# Patient Record
Sex: Female | Born: 1945 | Race: Black or African American | Hispanic: No | Marital: Single | Smoking: Never smoker
Health system: Southern US, Community
[De-identification: ages and names within clinical notes are randomized; demographics above are authoritative.]

## PROBLEM LIST (undated history)

## (undated) DIAGNOSIS — M1990 Unspecified osteoarthritis, unspecified site: Secondary | ICD-10-CM

## (undated) DIAGNOSIS — E7800 Pure hypercholesterolemia, unspecified: Secondary | ICD-10-CM

## (undated) DIAGNOSIS — M2012 Hallux valgus (acquired), left foot: Secondary | ICD-10-CM

## (undated) DIAGNOSIS — H269 Unspecified cataract: Secondary | ICD-10-CM

## (undated) DIAGNOSIS — K219 Gastro-esophageal reflux disease without esophagitis: Secondary | ICD-10-CM

## (undated) HISTORY — DX: Unspecified cataract: H26.9

## (undated) HISTORY — PX: ABDOMINAL HYSTERECTOMY: SUR658

## (undated) HISTORY — DX: Pure hypercholesterolemia, unspecified: E78.00

## (undated) HISTORY — DX: Gastro-esophageal reflux disease without esophagitis: K21.9

## (undated) HISTORY — DX: Hallux valgus (acquired), left foot: M20.12

## (undated) HISTORY — DX: Unspecified osteoarthritis, unspecified site: M19.90

---

## 2019-03-14 ENCOUNTER — Emergency Department (HOSPITAL_COMMUNITY)
Admission: EM | Admit: 2019-03-14 | Discharge: 2019-03-16 | Disposition: A | Discharge status: Other Acute Inpatient Hospital | Payer: Medicare Other | Attending: Emergency Medicine | Admitting: Emergency Medicine

## 2019-03-14 ENCOUNTER — Other Ambulatory Visit: Payer: Self-pay

## 2019-03-14 ENCOUNTER — Emergency Department (HOSPITAL_COMMUNITY): Payer: Medicare Other

## 2019-03-14 DIAGNOSIS — F29 Unspecified psychosis not due to a substance or known physiological condition: Secondary | ICD-10-CM | POA: Diagnosis not present

## 2019-03-14 DIAGNOSIS — F22 Delusional disorders: Secondary | ICD-10-CM | POA: Diagnosis not present

## 2019-03-14 DIAGNOSIS — R440 Auditory hallucinations: Secondary | ICD-10-CM | POA: Diagnosis not present

## 2019-03-14 LAB — CBC WITH DIFFERENTIAL/PLATELET
Abs Immature Granulocytes: 0.01 10*3/uL (ref 0.00–0.07)
Basophils Absolute: 0 10*3/uL (ref 0.0–0.1)
Basophils Relative: 0 %
Eosinophils Absolute: 0.1 10*3/uL (ref 0.0–0.5)
Eosinophils Relative: 1 %
HCT: 39.1 % (ref 36.0–46.0)
Hemoglobin: 12.9 g/dL (ref 12.0–15.0)
Immature Granulocytes: 0 %
Lymphocytes Relative: 36 %
Lymphs Abs: 2.3 10*3/uL (ref 0.7–4.0)
MCH: 33.7 pg (ref 26.0–34.0)
MCHC: 33 g/dL (ref 30.0–36.0)
MCV: 102.1 fL — ABNORMAL HIGH (ref 80.0–100.0)
Monocytes Absolute: 0.5 10*3/uL (ref 0.1–1.0)
Monocytes Relative: 8 %
Neutro Abs: 3.5 10*3/uL (ref 1.7–7.7)
Neutrophils Relative %: 55 %
Platelets: 296 10*3/uL (ref 150–400)
RBC: 3.83 MIL/uL — ABNORMAL LOW (ref 3.87–5.11)
RDW: 11.6 % (ref 11.5–15.5)
WBC: 6.4 10*3/uL (ref 4.0–10.5)
nRBC: 0 % (ref 0.0–0.2)

## 2019-03-14 LAB — URINALYSIS, ROUTINE W REFLEX MICROSCOPIC
Bacteria, UA: NONE SEEN
Bilirubin Urine: NEGATIVE
Glucose, UA: NEGATIVE mg/dL
Hgb urine dipstick: NEGATIVE
Ketones, ur: NEGATIVE mg/dL
Nitrite: NEGATIVE
Protein, ur: NEGATIVE mg/dL
Specific Gravity, Urine: 1.014 (ref 1.005–1.030)
pH: 7 (ref 5.0–8.0)

## 2019-03-14 LAB — RAPID URINE DRUG SCREEN, HOSP PERFORMED
Amphetamines: NOT DETECTED
Barbiturates: NOT DETECTED
Benzodiazepines: NOT DETECTED
Cocaine: NOT DETECTED
Opiates: NOT DETECTED
Tetrahydrocannabinol: NOT DETECTED

## 2019-03-14 LAB — ACETAMINOPHEN LEVEL: Acetaminophen (Tylenol), Serum: <10 ug/mL — ABNORMAL LOW (ref 10–30)

## 2019-03-14 LAB — COMPREHENSIVE METABOLIC PANEL
ALT: 17 U/L (ref 0–44)
AST: 23 U/L (ref 15–41)
Albumin: 4.4 g/dL (ref 3.5–5.0)
Alkaline Phosphatase: 65 U/L (ref 38–126)
Anion gap: 6 (ref 5–15)
BUN: 12 mg/dL (ref 8–23)
CO2: 29 mmol/L (ref 22–32)
Calcium: 9.4 mg/dL (ref 8.9–10.3)
Chloride: 105 mmol/L (ref 98–111)
Creatinine, Ser: 0.68 mg/dL (ref 0.44–1.00)
GFR calc Af Amer: >60 mL/min (ref >60)
GFR calc non Af Amer: >60 mL/min (ref >60)
Glucose, Bld: 103 mg/dL — ABNORMAL HIGH (ref 70–99)
Potassium: 3.7 mmol/L (ref 3.5–5.1)
Sodium: 140 mmol/L (ref 135–145)
Total Bilirubin: <0.1 mg/dL — ABNORMAL LOW (ref 0.3–1.2)
Total Protein: 8.1 g/dL (ref 6.5–8.1)

## 2019-03-14 LAB — ETHANOL: Alcohol, Ethyl (B): <10 mg/dL (ref <10)

## 2019-03-14 LAB — SALICYLATE LEVEL: Salicylate Lvl: <7 mg/dL (ref 2.8–30.0)

## 2019-03-14 LAB — AMMONIA: Ammonia: 34 umol/L (ref 9–35)

## 2019-03-14 LAB — TSH: TSH: 1.012 u[IU]/mL (ref 0.350–4.500)

## 2019-03-14 MED ORDER — ONDANSETRON HCL 4 MG PO TABS: 4.0000 mg | ORAL_TABLET | Freq: Three times a day (TID) | ORAL | Status: DC | PRN

## 2019-03-14 MED ORDER — ACETAMINOPHEN 325 MG PO TABS: 650.0000 mg | ORAL_TABLET | ORAL | Status: DC | PRN
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
## 2019-03-14 NOTE — ED Notes (Signed)
Patient transported to CT 

## 2019-03-14 NOTE — BH Assessment (Signed)
Faxed clinical information to the following facilities for placement:  [facility]  [facility]  [facility]  [facility]  [place], [facility], [facility], [facility] Triage Specialist [phone]
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
## 2019-03-14 NOTE — ED Provider Notes (Signed)
[facility]-EMERGENCY DEPT Provider Note   CSN: [id number] Arrival date & time: 03/14/19  1556    History   Chief Complaint Chief Complaint  Patient presents with  . Paranoid    HPI Sarah Grimes is a 73 y.o. female.     73yo F who p/w hallucinations.  Patient states that since October of last year, she has had progressively worsening problems with auditory hallucinations.  She states that she hears voices talking to her frequently.  They tell her that there are cameras all in her apartment to spy on her.  She says that she has not been able to escape and have any privacy.  She is gone over to her brother's house a few times but they have told her that they put cameras there too.  Her symptoms have progressively worsened and now she is having a lot of difficulty with sleep.  She has spoken with her PCP and has an appointment later this month.  She denies any headache, vision changes, fevers, or recent illness.  She cannot recall her current medications but denies any recent changes to her medications.  The history is provided by the patient.  LEVEL 5 CAVEAT DUE TO MENTAL STATUS CHANGE  No past medical history on file.  There are no active problems to display for this patient.   ** The histories are not reviewed yet. Please review them in the "History" navigator section and refresh this SmartLink.   OB History   No obstetric history on file.      Home Medications    Prior to Admission medications   Not on File    Family History No family history on file.  Social History Social History   Tobacco Use  . Smoking status: Not on file  Substance Use Topics  . Alcohol use: Not on file  . Drug use: Not on file     Allergies   Patient has no known allergies.   Review of Systems Review of Systems All other systems reviewed and are negative except that which was mentioned in HPI   Physical Exam Updated Vital Signs BP (!) 149/79   Pulse  60   Temp 98.1 F (36.7 C) (Oral)   Resp 18   Ht 5\' 5"  (1.651 m)   Wt 68 kg   SpO2 98%   BMI 24.96 kg/m   Physical Exam Vitals signs and nursing note reviewed.  Constitutional:      General: She is not in acute distress.    Appearance: She is well-developed.  HENT:     Head: Normocephalic and atraumatic.     Nose: Nose normal.     Mouth/Throat:     Mouth: Mucous membranes are moist.     Pharynx: Oropharynx is clear.  Eyes:     Extraocular Movements: Extraocular movements intact.     Conjunctiva/sclera: Conjunctivae normal.     Pupils: Pupils are equal, round, and reactive to light.  Neck:     Musculoskeletal: Neck supple.  Cardiovascular:     Rate and Rhythm: Normal rate and regular rhythm.     Heart sounds: Normal heart sounds. No murmur.  Pulmonary:     Effort: Pulmonary effort is normal.     Breath sounds: Normal breath sounds.  Abdominal:     General: Bowel sounds are normal. There is no distension.     Palpations: Abdomen is soft.     Tenderness: There is no abdominal tenderness.  Skin:  General: Skin is warm and dry.  Neurological:     Mental Status: She is alert and oriented to person, place, and time.     Comments: Fluent speech  Psychiatric:        Attention and Perception: Attention normal. She perceives auditory hallucinations.        Mood and Affect: Affect is tearful.        Speech: Speech normal.        Behavior: Behavior normal. Behavior is cooperative.        Thought Content: Thought content is paranoid and delusional.        Judgment: Judgment normal.      ED Treatments / Results  Labs (all labs ordered are listed, but only abnormal results are displayed) Labs Reviewed  COMPREHENSIVE METABOLIC PANEL - Abnormal; Notable for the following components:      Result Value   Glucose, Bld 103 (*)    Total Bilirubin <0.1 (*)    All other components within normal limits  ACETAMINOPHEN LEVEL - Abnormal; Notable for the following components:    Acetaminophen (Tylenol), Serum <10 (*)    All other components within normal limits  CBC WITH DIFFERENTIAL/PLATELET - Abnormal; Notable for the following components:   RBC 3.83 (*)    MCV 102.1 (*)    All other components within normal limits  URINALYSIS, ROUTINE W REFLEX MICROSCOPIC - Abnormal; Notable for the following components:   Leukocytes,Ua SMALL (*)    All other components within normal limits  TSH  AMMONIA  SALICYLATE LEVEL  ETHANOL  RAPID URINE DRUG SCREEN, HOSP PERFORMED    EKG None  Radiology Ct Head Wo Contrast  Result Date: 03/14/2019 CLINICAL DATA:  AMS, hallucinations, confusion EXAM: CT HEAD WITHOUT CONTRAST TECHNIQUE: Contiguous axial images were obtained from the base of the skull through the vertex without intravenous contrast. COMPARISON:  None. FINDINGS: Brain: No evidence of acute infarction, hemorrhage, hydrocephalus, extra-axial collection or mass lesion/mass effect. Periventricular and deep white matter hypodensity. Vascular: No hyperdense vessel or unexpected calcification. Skull: Normal. Negative for fracture or focal lesion. Sinuses/Orbits: No acute finding. Other: None. IMPRESSION: No acute intracranial pathology.  Small-vessel white matter disease. Electronically Signed   By: Eddie Candle M.D.   On: 03/14/2019 17:17    Procedures Procedures (including critical care time)  Medications Ordered in ED Medications - No data to display   Initial Impression / Assessment and Plan / ED Course  I have reviewed the triage vital signs and the nursing notes.  Pertinent labs & imaging results that were available during my care of the patient were reviewed by me and considered in my medical decision making (see chart for details).       PT alert, pleasant, occasionally tearful but no racing speech or tangential thought process. Obtained screening labs and head CT to r/u intracranial process.  Labwork unremarkable. Normal TSH, tox labs negative, no evidence of  infectious process. Head CT negative.  I had a long discussion with daughter who states that her symptoms have been progressively worsening and PCP has been trying to get her in with a psychiatrist but the appointment is not until July.  Daughter is concerned about her safety as patient has been going outside, scared to stay inside because she feels like she is being watched.  It sounds like the patient is being considered for a diagnosis of dementia.  I have contacted TTS and they have recommended inpatient geriatric psych treatment. PT will await bed placement.  Final Clinical Impressions(s) / ED Diagnoses   Final diagnoses:  Auditory hallucinations  Delusions [facility](HCC)    ED Discharge Orders    None       , Ambrose Finlandachel Morgan, MD 03/14/19 2049

## 2019-03-15 DIAGNOSIS — F22 Delusional disorders: Secondary | ICD-10-CM

## 2019-03-15 DIAGNOSIS — R440 Auditory hallucinations: Secondary | ICD-10-CM | POA: Insufficient documentation

## 2019-03-15 MED ORDER — PANTOPRAZOLE SODIUM 40 MG PO TBEC
40.0000 mg | DELAYED_RELEASE_TABLET | Freq: Every day | ORAL | Status: DC
  Administered 2019-03-15: 40 mg via ORAL
  Filled 2019-03-15: qty 1

## 2019-03-15 MED ORDER — TRAZODONE HCL 50 MG PO TABS
50.0000 mg | ORAL_TABLET | Freq: Every day | ORAL | Status: DC
  Administered 2019-03-15: 50 mg via ORAL
  Filled 2019-03-15: qty 1

## 2019-03-15 MED ORDER — LISINOPRIL 5 MG PO TABS
5.0000 mg | ORAL_TABLET | Freq: Every day | ORAL | Status: DC
  Administered 2019-03-15: 5 mg via ORAL
  Filled 2019-03-15 (×2): qty 1

## 2019-03-15 MED ORDER — FAMOTIDINE 20 MG PO TABS: 20.0000 mg | ORAL_TABLET | Freq: Every day | ORAL | Status: DC

## 2019-03-15 MED ORDER — SIMVASTATIN 40 MG PO TABS
40.0000 mg | ORAL_TABLET | Freq: Every day | ORAL | Status: DC
  Administered 2019-03-15: 40 mg via ORAL
  Filled 2019-03-15 (×2): qty 1

## 2019-03-15 NOTE — ED Notes (Signed)
Patient's IVC complete and signed. Sheriffs office called for patient transport, but was told it was too late and for nurse to call tomorrow at 0800.

## 2019-03-15 NOTE — Progress Notes (Signed)
Patient is delusional and paranoid.  She will be IVC'd based on her altered cognition and will be IVC'd prior to going to [facility], [facility].  Daughter, [name], was called per patient's permission, no answer with a "voicemail is full" message.    Waylan Boga, PMHNP

## 2019-03-15 NOTE — ED Notes (Signed)
Patient ambulated to restroom with standby assist. Patient ambulated with steady gait. Breakfast tray provided to patient. Patient VSS.

## 2019-03-15 NOTE — ED Notes (Signed)
Patient is IVC 
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
## 2019-03-15 NOTE — BH Assessment (Addendum)
[facility] Assessment Progress Note  Per Buford Dresser, DO, this pt requires psychiatric hospitalization at this time.  Waylan Boga, DNP  also finds that pt meets criteria for IVC, which she has initiated.  IVC documents have been faxed to [facility], and at [employer] confirms receipt.  She has since faxed Findings and Custody Order to this Probation officer.  At 16:12 I called [employer], who took demographic information, agreeing to dispatch law enforcement to fill out Return of Service.  As of this writing arrival of law enforcement is pending.   At 12:25 Tammy calls from [place] to report that pt has been accepted to their facility by Dr Mel Almond.  Dr Mariea Clonts concurs with this decision.  Pt's nurse has been notified, and agrees to call report to [phone].  Pt is to be transported via [facility] Coordinator [phone]   Addendum:  At 16:44 Karlene Einstein at [facility] confirms receipt of IVC documents.  Jalene Mullet, [place] Coordinator [phone]

## 2019-03-15 NOTE — ED Notes (Addendum)
Report called to [employer] at [facility].

## 2019-03-15 NOTE — Consult Note (Addendum)
[facility] Face-to-Face Psychiatry Consult   Reason for Consult:  Paranoia, hallucinations, delusions Referring Physician:  EDP Patient Identification: Sarah Grimes MRN:  [id number] Principal Diagnosis: Paranoia Diagnosis:  Acute Psychosis Total Time spent with patient: 45 minutes  Subjective:   Sarah Grimes is a 73 y.o. female patient admitted with paranoia that people she sees are following her, visual and auditory hallucinations.  Anxiety is high related to this.  The voices told her to go outside her apartment where her son found her sitting in the heat.  She does not bathe due to the people watching her and eats minimal.  On assessment, she talks about an inplant in her head where they can listen to her even in the ED.  Pleasant and cooperative with no substance abuse or suicidal/homicidal ideations.  Called her daughter with her permission but no answer and voicemail was full.  HPI from TTS on admission:  73 y.o. female who presents to [facility] ED reporting auditory hallucinations and symptoms of paranoia. Pt says her symptoms started in October of 2019 when she was hearing loud music coming from her neighbor's residence that other people didn't hear. Pt says for the past month she has been hearing her neighbors talking. She says she hears a voice telling her that there are cameras installed in her house and car. She says a voice tells her that her neighbor is on her porch or coming through her window. She says she believes someone has put a stint in her ear that is transmitting these voices. She says the voices used to only be in her house but now they are following her in other locations and describing all her actions.   Pt says "I have no peace." She says she is unable to sleep more than a couple of hours. She says she has not been bathing as often as she normally does because she doesn't want people to see her through the cameras. She says she is eating less because she feels  anxious. She denies current suicidal ideation or any history of suicide attempts. Pt denies any history of intentional self-injurious behaviors. Pt denies current homicidal ideation or history of violence. Pt denies any history of auditory or visual hallucinations. Pt denies history of alcohol or other substance use.  Pt lives alone in an apartment. She identifies her brother and her daughter as her primary supports. Pt says she discussed hearing voices with her primary care physician who recommended Pt see a psychiatrist. Pt cannot identify any stressors other than these voices. Pt has hearing loss but is able to hear over the telephone and carry on a normal conversation. She denies any history of inpatient or outpatient mental health treatment.   With Pt's permission, TTS spoke with Pt's daughter, Sarah Grimes [phone]. She confirms these symptoms started with hearing music in October. She says these voices have gradually increased. She says Pt has misplaced objects an insisted the neighbor has enter her apartment and taken things. Pt has called law enforcement on the neighbors. She says Pt won't turn the lights on because she doesn't want the cameras to see her. She says she has found Pt sitting outside her apartment in the hot sun because voices have told her there is a bomb in the apartment. Pt's daughter reports yesterday Pt said that the voice was telling her to kill herself. Daughter called Pt's PCP who said to bring Pt to ED. Daughter confirms Pt is completely independent in caring for  herself.  Past Psychiatric History: Auditory hallucinations  Risk to Self: Suicidal Ideation: No Suicidal Intent: No Is patient at risk for suicide?: No Suicidal Plan?: No Access to Means: No What has been your use of drugs/alcohol within the last 12 months?: Pt denies How many times?: 0 Other Self Harm Risks: Pt responding to auditory hallucinations Triggers for Past Attempts: None  known Intentional Self Injurious Behavior: None Risk to Others: Homicidal Ideation: No Thoughts of Harm to Others: No Current Homicidal Intent: No Current Homicidal Plan: No Access to Homicidal Means: No Identified Victim: None History of harm to others?: No Assessment of Violence: None Noted Violent Behavior Description: Pt denies history of violence Does patient have access to weapons?: No Criminal Charges Pending?: No Does patient have a court date: No Prior Inpatient Therapy: Prior Inpatient Therapy: No Prior Outpatient Therapy: Prior Outpatient Therapy: No Does patient have an ACCT team?: No Does patient have Intensive In-House Services?  : No Does patient have [facility] services? : No Does patient have P4CC services?: No  Family History: No family history on file. Family Psychiatric  History: None Social History:  Social History   Substance and Sexual Activity  Alcohol Use Not on file     Social History   Substance and Sexual Activity  Drug Use Not on file    Social History   Socioeconomic History  . Marital status: Single    Spouse name: Not on file  . Number of children: Not on file  . Years of education: Not on file  . Highest education level: Not on file  Occupational History  . Not on file  Social Needs  . Financial resource strain: Not on file  . Food insecurity:    Worry: Not on file    Inability: Not on file  . Transportation needs:    Medical: Not on file    Non-medical: Not on file  Tobacco Use  . Smoking status: Not on file  Substance and Sexual Activity  . Alcohol use: Not on file  . Drug use: Not on file  . Sexual activity: Not on file  Lifestyle  . Physical activity:    Days per week: Not on file    Minutes per session: Not on file  . Stress: Not on file  Relationships  . Social connections:    Talks on phone: Not on file    Gets together: Not on file    Attends religious service: Not on file    Active member of club or  organization: Not on file    Attends meetings of clubs or organizations: Not on file    Relationship status: Not on file  Other Topics Concern  . Not on file  Social History Narrative  . Not on file   Additional Social History: N/A    Allergies:  No Known Allergies  Labs:  Results for orders placed or performed during the hospital encounter of 03/14/19 (from the past 48 hour(s))  Comprehensive metabolic panel     Status: Abnormal   Collection Time: 03/14/19  4:40 PM  Result Value Ref Range   Sodium 140 135 - 145 mmol/L   Potassium 3.7 3.5 - 5.1 mmol/L   Chloride 105 98 - 111 mmol/L   CO2 29 22 - 32 mmol/L   Glucose, Bld 103 (H) 70 - 99 mg/dL   BUN 12 8 - 23 mg/dL   Creatinine, Ser 4.090.68 0.44 - 1.00 mg/dL   Calcium 9.4 8.9 - 81.110.3 mg/dL  Total Protein 8.1 6.5 - 8.1 g/dL   Albumin 4.4 3.5 - 5.0 g/dL   AST 23 15 - 41 U/L   ALT 17 0 - 44 U/L   Alkaline Phosphatase 65 38 - 126 U/L   Total Bilirubin <0.1 (L) 0.3 - 1.2 mg/dL   GFR calc non Af Amer >60 >60 mL/min   GFR calc Af Amer >60 >60 mL/min   Anion gap 6 5 - 15    Comment: Performed at [facility], [place] [address] [place], [place] [address]  TSH     Status: None   Collection Time: 03/14/19  4:40 PM  Result Value Ref Range   TSH 1.012 0.350 - 4.500 uIU/mL    Comment: Performed by a 3rd Generation assay with a functional sensitivity of <=0.01 uIU/mL. Performed at [facility], [place] [address], [place], [place] [address]   Ammonia     Status: None   Collection Time: 03/14/19  4:40 PM  Result Value Ref Range   Ammonia 34 9 - 35 umol/L    Comment: Performed at [facility], [place] [address], [place], [place] [address]  Acetaminophen level     Status: Abnormal   Collection Time: 03/14/19  4:40 PM  Result Value Ref Range   Acetaminophen (Tylenol), Serum <10 (L) 10 - 30 ug/mL    Comment: (NOTE) Therapeutic concentrations vary significantly. A range of 10-30 ug/mL  may  be an effective concentration for many patients. However, some  are best treated at concentrations outside of this range. Acetaminophen concentrations >150 ug/mL at 4 hours after ingestion  and >50 ug/mL at 12 hours after ingestion are often associated with  toxic reactions. Performed at [facility], [place] [address], [place], [place] [address]   Salicylate level     Status: None   Collection Time: 03/14/19  4:40 PM  Result Value Ref Range   Salicylate Lvl <4.2 2.8 - 30.0 mg/dL    Comment: Performed at [facility], [place] [address], [place], [place] [address]  Ethanol     Status: None   Collection Time: 03/14/19  4:40 PM  Result Value Ref Range   Alcohol, Ethyl (B) <10 <10 mg/dL    Comment: (NOTE) Lowest detectable limit for serum alcohol is 10 mg/dL. For medical purposes only. Performed at [facility], [place] [address], [place],  [address]   CBC with Differential     Status: Abnormal   Collection Time: 03/14/19  4:40 PM  Result Value Ref Range   WBC 6.4 4.0 - 10.5 K/uL   RBC 3.83 (L) 3.87 - 5.11 MIL/uL   Hemoglobin 12.9 12.0 - 15.0 g/dL   HCT 39.1 36.0 - 46.0 %   MCV 102.1 (H) 80.0 - 100.0 fL   MCH 33.7 26.0 - 34.0 pg   MCHC 33.0 30.0 - 36.0 g/dL   RDW 11.6 11.5 - 15.5 %   Platelets 296 150 - 400 K/uL   nRBC 0.0 0.0 - 0.2 %   Neutrophils Relative % 55 %   Neutro Abs 3.5 1.7 - 7.7 K/uL   Lymphocytes Relative 36 %   Lymphs Abs 2.3 0.7 - 4.0 K/uL   Monocytes Relative 8 %   Monocytes Absolute 0.5 0.1 - 1.0 K/uL   Eosinophils Relative 1 %   Eosinophils Absolute 0.1 0.0 - 0.5 K/uL   Basophils Relative 0 %   Basophils Absolute 0.0 0.0 - 0.1 K/uL  Immature Granulocytes 0 %   Abs Immature Granulocytes 0.01 0.00 - 0.07 K/uL    Comment: Performed at [facility], [address] [place], [place] [address]  Urinalysis, Routine w reflex microscopic     Status: Abnormal   Collection Time: 03/14/19   5:45 PM  Result Value Ref Range   Color, Urine YELLOW YELLOW   APPearance CLEAR CLEAR   Specific Gravity, Urine 1.014 1.005 - 1.030   pH 7.0 5.0 - 8.0   Glucose, UA NEGATIVE NEGATIVE mg/dL   Hgb urine dipstick NEGATIVE NEGATIVE   Bilirubin Urine NEGATIVE NEGATIVE   Ketones, ur NEGATIVE NEGATIVE mg/dL   Protein, ur NEGATIVE NEGATIVE mg/dL   Nitrite NEGATIVE NEGATIVE   Leukocytes,Ua SMALL (A) NEGATIVE   RBC / HPF 0-5 0 - 5 RBC/hpf   WBC, UA 0-5 0 - 5 WBC/hpf   Bacteria, UA NONE SEEN NONE SEEN   Squamous Epithelial / LPF 0-5 0 - 5    Comment: Performed at [facility], [address] [place], [place] [address]  Urine rapid drug screen (hosp performed)     Status: None   Collection Time: 03/14/19  5:45 PM  Result Value Ref Range   Opiates NONE DETECTED NONE DETECTED   Cocaine NONE DETECTED NONE DETECTED   Benzodiazepines NONE DETECTED NONE DETECTED   Amphetamines NONE DETECTED NONE DETECTED   Tetrahydrocannabinol NONE DETECTED NONE DETECTED   Barbiturates NONE DETECTED NONE DETECTED    Comment: (NOTE) DRUG SCREEN FOR MEDICAL PURPOSES ONLY.  IF CONFIRMATION IS NEEDED FOR ANY PURPOSE, NOTIFY LAB WITHIN 5 DAYS. LOWEST DETECTABLE LIMITS FOR URINE DRUG SCREEN Drug Class                     Cutoff (ng/mL) Amphetamine and metabolites    1000 Barbiturate and metabolites    200 Benzodiazepine                 200 Tricyclics and metabolites     300 Opiates and metabolites        300 Cocaine and metabolites        300 THC                            50 Performed at [facility], [address] [place], [place] [address]     Current Facility-Administered Medications  Medication Dose Route Frequency Provider Last Rate Last Dose  . acetaminophen (TYLENOL) tablet 650 mg  650 mg Oral Q4H PRN Little, Ambrose Finlandachel Morgan, MD      . famotidine (PEPCID) tablet 20 mg  20 mg Oral Daily Lord, Jamison Y, NP      . lisinopril (ZESTRIL) tablet 5 mg  5 mg Oral  Daily Charm RingsLord, Jamison Y, NP   5 mg at 03/15/19 0953  . ondansetron (ZOFRAN) tablet 4 mg  4 mg Oral Q8H PRN Little, Ambrose Finlandachel Morgan, MD      . pantoprazole (PROTONIX) EC tablet 40 mg  40 mg Oral Daily Charm RingsLord, Jamison Y, NP   40 mg at 03/15/19 0953  . simvastatin (ZOCOR) tablet 40 mg  40 mg Oral Daily Charm RingsLord, Jamison Y, NP   40 mg at 03/15/19 0953  . traZODone (DESYREL) tablet 50 mg  50 mg Oral QHS Charm RingsLord, Jamison Y, NP       Current Outpatient Medications  Medication Sig Dispense Refill  . famotidine (PEPCID) 20 MG tablet Take 20 mg  by mouth daily.    Marland Kitchen. lisinopril (ZESTRIL) 5 MG tablet Take 5 mg by mouth daily.    . pantoprazole (PROTONIX) 40 MG tablet Take 40 mg by mouth daily.    . simvastatin (ZOCOR) 40 MG tablet Take 40 mg by mouth daily.    . traZODone (DESYREL) 50 MG tablet Take 50 mg by mouth at bedtime.      Musculoskeletal: Strength & Muscle Tone: within normal limits Gait & Station: normal Patient leans: N/A  Psychiatric Specialty Exam: Physical Exam  Nursing note and vitals reviewed. Constitutional: She is oriented to person, place, and time. She appears well-developed and well-nourished.  HENT:  Head: Normocephalic.  Neck: Normal range of motion.  Respiratory: Effort normal.  Musculoskeletal: Normal range of motion.  Neurological: She is alert and oriented to person, place, and time.  Psychiatric: Her speech is normal and behavior is normal. Her mood appears anxious. Thought content is paranoid. Cognition and memory are impaired. She expresses inappropriate judgment.    Review of Systems  Psychiatric/Behavioral: The patient is nervous/anxious.   All other systems reviewed and are negative.   Blood pressure (!) 98/57, pulse 69, temperature 97.8 F (36.6 C), resp. rate 17, height 5\' 5"  (1.651 m), weight 68 kg, SpO2 99 %.Body mass index is 24.96 kg/m.  General Appearance: Casual  Eye Contact:  Good  Speech:  Normal Rate  Volume:  Normal  Mood:  Anxious  Affect:  Congruent   Thought Process:  Coherent and Descriptions of Associations: Intact  Orientation:  Full (Time, Place, and Person)  Thought Content:  Delusions and Paranoid Ideation  Suicidal Thoughts:  No  Homicidal Thoughts:  No  Memory:  Immediate;   Good Recent;   Good Remote;   Good  Judgement:  Fair  Insight:  Lacking  Psychomotor Activity:  Normal  Concentration:  Concentration: Fair and Attention Span: Fair  Recall:  Good  Fund of Knowledge:  Good  Language:  Good  Akathisia:  No  Handed:  Right  AIMS (if indicated):   N/A  Assets:  Housing Leisure Time Physical Health Resilience Social Support  ADL's:  Intact  Cognition:  Impaired,  Mild  Sleep:   N/A     Treatment Plan Summary: Daily contact with patient to assess and evaluate symptoms and progress in treatment, Medication management and Plan paranoia, acute psychosis:  -Inpatient hospitalization, transferred to accepting hospital  Insomnia: -Started Trazodone 50 mg qhs  Disposition: Recommend psychiatric Inpatient admission when medically cleared.  Nanine MeansJamison Lord, NP 03/15/2019 3:34 PM   Patient's chart reviewed and case discussed with the physician extender and developed treatment plan. Reviewed the information documented and agree with the treatment plan.  Juanetta BeetsJacqueline , DO 03/17/19 2:56 PM

## 2019-03-16 NOTE — Discharge Instructions (Signed)
Transfer to [facility].

## 2019-03-16 NOTE — Progress Notes (Signed)
Received Sarah Grimes in her room asleep with the sitter at the bedside. No noted distress. She continued to sleep throughout the night.

## 2019-03-16 NOTE — ED Provider Notes (Signed)
Patient alert, content, nad.  [facility] team indicates accepted to [facility], Dr Gordan Payment. Vital signs normal.  Patient appears stable for transfer.      Lajean Saver, MD 03/16/19 [phone]

## 2019-03-16 NOTE — ED Notes (Signed)
Pt discharged safely after calling her daughter.  Family was upset that she was going so far.  I explained that we have to take the hospital that accepts her.  Pt was in no distress.  She was paranoid and delusional

## 2019-03-16 NOTE — Discharge Summary (Signed)
  Patient to be transferred to [facility] for inpatient psychiatric treatment  Yosiah Jasmin B. Fitzpatrick Alberico, NP

## 2019-04-21 ENCOUNTER — Ambulatory Visit (INDEPENDENT_AMBULATORY_CARE_PROVIDER_SITE_OTHER): Payer: Medicare Other | Admitting: Neurology

## 2019-04-21 ENCOUNTER — Other Ambulatory Visit: Payer: Self-pay

## 2019-04-21 ENCOUNTER — Telehealth: Payer: Self-pay | Admitting: Neurology

## 2019-04-21 ENCOUNTER — Telehealth: Payer: Self-pay

## 2019-04-21 ENCOUNTER — Encounter: Payer: Self-pay | Admitting: Neurology

## 2019-04-21 VITALS — BP 112/65 | HR 72 | Ht 64.0 in | Wt 149.0 lb

## 2019-04-21 DIAGNOSIS — F0391 Unspecified dementia with behavioral disturbance: Secondary | ICD-10-CM | POA: Diagnosis not present

## 2019-04-21 DIAGNOSIS — R454 Irritability and anger: Secondary | ICD-10-CM

## 2019-04-21 DIAGNOSIS — R440 Auditory hallucinations: Secondary | ICD-10-CM | POA: Diagnosis not present

## 2019-04-21 DIAGNOSIS — G3109 Other frontotemporal dementia: Secondary | ICD-10-CM | POA: Diagnosis not present

## 2019-04-21 NOTE — Telephone Encounter (Signed)
[employer] Medicare order sent to [facility]. No auth they will reach out to the patient to schedule.  °

## 2019-04-21 NOTE — Telephone Encounter (Signed)
Dr. Duwaine Maxin at [facility] referred the pt to [place]. Unfortunately, that office cannot send [place] records to assist with the referral.

## 2019-04-21 NOTE — Progress Notes (Signed)
Subjective:    Patient ID: Precious HawsBetty Ruth Ervine is a 73 y.o. female.  HPI     Huston FoleySaima Breyer Tejera, MD, PhD [facility] [address], [address] [address] [place], [place] [id number]  Dear Dr. Andrey CampanileWilson, I saw your patient, Denyce RobertBetty Santacroce, upon your kind request to my neurologic clinic today for initial consultation of her cognitive disorder, concern for Alzheimer's disease with behavioral disturbance.  The patient is accompanied by her daughter, Tamala JulianGvonnia, today.  As you know, Ms. Adrian BlackwaterStinson is a 73 year old right-handed woman with an underlying medical history of hypertension, hyperlipidemia, cataracts, osteoarthritis, diabetes, and reflux disease, who has had altered mental status recently, and a psychiatric admission warranted secondary to delusions and hallucinations in June 2020.  I was able to review some hospital records, and we requested your office records as well.  She [facility] secondary to delusions and hallucinations.  Her symptoms started in October.  She has had auditory hallucinations primarily, she also had paranoid delusions, no premorbid mood disorder.  In the hospital, she was treated with IM Benadryl as needed, Colace as needed, haloperidol 5 mg every 4 hours as needed as well as IM Haldol, hydroxyzine 50 mg every 4 hours as needed and IM Ativan 2 mg strength every 6 hours as needed as well as Ambien 5 mg strength nightly as needed.  She was discharged from her psychiatric admission on 03/26/19, per daughter. She was kept on Haldol only.  She is no longer on her reflux or cholesterol medicine or blood pressure medicine or baby aspirin.  I reviewed your office notes as well from 01/07/2018, 04/14/2018, 07/16/2018, 01/21/2019, at which time she had blood work including A1c, CMP, CBC with differential, lipid profile, vitamin D.  She was advised to seek help with her hearing aids at the time.  I reviewed her blood test  results, A1c was 6.0, LDL 105, AST 21, ALT 14, creatinine 0.65, BUN 11.  She had a follow-up on 02/12/2019.  At that time her daughter was concerned about behavioral changes in her mother over the past year, she was having visual hallucinations.  She had a follow-up on 02/17/2019.  She had memory changes.  She had ongoing auditory hallucinations, daughter had mentioned cognitive changes for about 6 months at the time including misplacing things.  She had a follow-up on 03/30/2019.  Daughter reports forgetfulness and patient has been misplacing things.  The patient has 1 daughter and 1 son.  [place] in [place], [place] in [place].  Since her hospital discharge they have been taking turns to stay with her overnight, she is not alone at night.  Daughter reports ongoing issues with delusions and the neighbors that she was concerned about have not been staying there, have moved away a while ago.  The patient does insist that these are not hallucinations and these is talking to.  She is currently very cooperative and able to participate in the history but daughter reports that patient has had some anger outburst as well.  She is not sleeping very well.  She takes Haldol 3 times a day.  She has an appointment with a psychiatrist next month.  She has not been driving very much but has driven since her hospital discharge to her daughter's house.  She is a non-smoker and does not utilize alcohol and drinks caffeine in the form of sweet tea, about 2 glasses/day on average.  She has been from a larger  family, she had a total of 6 brothers, one passed away, she had 5 sisters, 2 passed away.  None of her siblings had any memory issues or have any memory loss.  Her father apparently had some memory issues in the later years of his life, passed away in his 4180s.  Her Past Medical History Is Significant For: Past Medical History:  Diagnosis Date  . Cataract, bilateral   . GERD (gastroesophageal reflux disease)   . Hallux  valgus, left   . High cholesterol   . Osteoarthritis     Her Past Surgical History Is Significant For:   Her Family History Is Significant For: No family history on file.  Her Social History Is Significant For: Social History   Socioeconomic History  . Marital status: Single    Spouse name: Not on file  . Number of children: Not on file  . Years of education: Not on file  . Highest education level: Not on file  Occupational History  . Not on file  Social Needs  . Financial resource strain: Not on file  . Food insecurity    Worry: Not on file    Inability: Not on file  . Transportation needs    Medical: Not on file    Non-medical: Not on file  Tobacco Use  . Smoking status: Never Smoker  Substance and Sexual Activity  . Alcohol use: Never    Frequency: Never  . Drug use: Not on file  . Sexual activity: Not on file  Lifestyle  . Physical activity    Days per week: Not on file    Minutes per session: Not on file  . Stress: Not on file  Relationships  . Social Musicianconnections    Talks on phone: Not on file    Gets together: Not on file    Attends religious service: Not on file    Active member of club or organization: Not on file    Attends meetings of clubs or organizations: Not on file    Relationship status: Not on file  Other Topics Concern  . Not on file  Social History Narrative  . Not on file    Her Allergies Are:  No Known Allergies:   Her Current Medications Are:  Outpatient Encounter Medications as of 04/21/2019  Medication Sig  . haloperidol (HALDOL) 0.5 MG tablet Take 0.5 mg by mouth 2 (two) times daily.   . haloperidol (HALDOL) 1 MG tablet Take 1 mg by mouth at bedtime.   . [DISCONTINUED] Calcium Carbonate-Simethicone (MAALOX MAX PO) Take by mouth.  . [DISCONTINUED] docusate sodium (COLACE) 100 MG capsule Take 100 mg by mouth 2 (two) times daily as needed for mild constipation.  . [DISCONTINUED] famotidine (PEPCID) 20 MG tablet Take 20 mg by mouth  daily.  . [DISCONTINUED] hydrOXYzine (ATARAX/VISTARIL) 50 MG tablet Take 50 mg by mouth every 4 (four) hours as needed.  . [DISCONTINUED] lisinopril (ZESTRIL) 5 MG tablet Take 5 mg by mouth daily.  . [DISCONTINUED] pantoprazole (PROTONIX) 40 MG tablet Take 40 mg by mouth daily.  . [DISCONTINUED] simvastatin (ZOCOR) 40 MG tablet Take 40 mg by mouth daily.  . [DISCONTINUED] traZODone (DESYREL) 50 MG tablet Take 50 mg by mouth at bedtime.  . [DISCONTINUED] zolpidem (AMBIEN) 5 MG tablet Take 5 mg by mouth at bedtime as needed for sleep.   No facility-administered encounter medications on file as of 04/21/2019.   :   Review of Systems:  Out of a complete 14 point  review of systems, all are reviewed and negative with the exception of these symptoms as listed below:    Review of Systems  Neurological:       Pt presents today to discuss her auditory hallucinations. She is wondering if it could be caused by dementia. She was told to stop all medications except haldol until cleared by neuro.    Objective:  Neurological Exam  Physical Exam Physical Examination:   Vitals:   04/21/19 1437  BP: 112/65  Pulse: 72    General Examination: The patient is a very pleasant 73 y.o. female in no acute distress. She appears well-developed and well-nourished and well groomed.   HEENT: Normocephalic, atraumatic, pupils are equal, round and reactive to light and accommodation. Extraocular tracking is well preserved, hearing is Impaired.  She has no significant facial masking, she has normal facial symmetry, normal facial sensation, airway examination reveals mild to moderate mouth dryness, tongue protrudes centrally in palate elevates symmetrically.  No involuntary abnormal movements of the tongue or mouth.  Neck is supple with no nuchal rigidity noted  Chest: Clear to auscultation without wheezing, rhonchi or crackles noted.  Heart: S1+S2+0, regular and normal without murmurs, rubs or gallops noted.    Abdomen: Soft, non-tender and non-distended with normal bowel sounds appreciated on auscultation.  Extremities: There is trace pitting edema in the R ankle. Pedal pulses are intact.  Skin: Warm and dry without trophic changes noted. There are no varicose veins.  Musculoskeletal: exam reveals no obvious joint deformities, tenderness or joint swelling or erythema.   Neurologically:  Mental status: The patient is awake, alert and oriented in all 4 spheres, with the exception of Dr. And state.  Her remote memory is fairly well-preserved, she does have difficulty relating her recent history.She is calm and cooperative, mood is normal, she has no obvious anxiety, no obvious hallucinations.  On 04/21/2019: MMSE: 22/30 (She missed 2 points on Orientation, 4 points on serial 7, 1 point on recall, 1 point on copying the figure.), CDT: 3/4, AFT: 7/min. Cranial nerves II - XII are as described above under HEENT exam. In addition: shoulder shrug is normal with equal shoulder height noted. Motor exam: Normal bulk, strength and tone is noted. There is no drift, tremor or rebound. Romberg is negative. Reflexes are 2+ throughout. Babinski: Toes are flexor bilaterally. Fine motor skills and coordination: grossly intact.  Cerebellar testing: No dysmetria or intention tremor on finger to nose testing. Heel to shin is unremarkable bilaterally. There is no truncal or gait ataxia.  Sensory exam: intact to light touch in the upper and lower extremities.  Gait, station and balance: She stands easily. No veering to one side is noted. No leaning to one side is noted. Posture is age-appropriate and stance is narrow based. Gait shows normal stride length and normal pace. No problems turning are noted.              Assessment and Plan:  Assessment and Plan:  In summary, Vergene Josephina ShihRuth Nishi is a very pleasant 73 y.o.-year old female with an underlying medical history of hypertension, hyperlipidemia, cataracts,  osteoarthritis, diabetes, and reflux disease, who Presents for evaluation of her memory loss.  She had last month for hallucinations and delusions.  She is currently on Haldol and has a follow-up with a psychiatrist pending next month.  Her history is not telltale for Alzheimer's disease but dementia with behavioral disturbance is certainly in the differential including frontotemporal dementia, Lewy body dementia.  However, she  has no signs of parkinsonism and the latter diagnosis is unlikely in my opinion.  I am not sure what work-up she had in the hospital setting, she had blood work in the recent past through your office.  We will add additional blood work today, in addition I would like to do an EEG and brain MRI with and without contrast as well is more in-depth memory evaluation with the help of a neuropsychologist.  I discussed this At length with the patient and her daughter.  Supervision is key.  She is furthermore advised no longer to drive as she may get lost and confused. I had a long chat with the patient and her daughter about my findings and the diagnosis of memory loss and dementia, its prognosis and treatment options. We will seek work-up and consider medication for dementia in the near future. We talked about medical treatments and non-pharmacological approaches. We talked about maintaining a healthy lifestyle in general and staying active mentally and physically. I encouraged the patient to eat healthy, exercise daily and keep well hydrated, to keep a scheduled bedtime and wake time routine, to not skip any meals and eat healthy snacks in between meals and to have protein with every meal. I stressed the importance of regular exercise, within of course the patient's own mobility limitations. She has lost some weight as I understand. I suggested close supervision and close psychiatric follow-up as well.  I answered all their questions today and the patient her daughter were in agreement with the  above outlined plan. I would like to see the patient back after these tests/evaluations and We will also keep them posted in the interim by phone call regarding her test results. This was an extended visit.    Thank you very much for allowing me to participate in the care of this nice patient. If I can be of any further assistance to you please do not hesitate to call me at [phone].  Sincerely,   Star Age, MD, PhD

## 2019-04-21 NOTE — Telephone Encounter (Signed)
Dr. Rexene Alberts needs further records for this pt's neuro and [facility] admission prior to her appt with pt, per Dr. Guadelupe Sabin request. I called the referring office Dr. Redmond Pulling at [facility], was on the phone for 10 minutes. They could not help me obtain these records but will ask clinical staff for assistance with this and get back with [place]. If this office calls back, please ask them to fax all pertinent neurological, psychiatry, and behavioral health records to [phone].

## 2019-04-21 NOTE — Patient Instructions (Addendum)
We will try to get to the bottom of your memory issues.  Please follow-up with your primary care physician and your psychiatrist as well.  I do not suggest any new medications quite yet. We will check blood work today and call you with the test results. We will do an EEG (brainwave test), which we will schedule. We will call you with the results. We will do a brain scan, called MRI and call you with the test results. We will have to schedule you for this on a separate date. This test requires authorization from your insurance, and we will take care of the insurance process. We do a formal neuropsychological test (aka cognitive testing) for your memory complaints. This requires a referral to a trained and licensed neuropsychologist and will be a separate appointment at a different clinic.

## 2019-04-21 NOTE — Telephone Encounter (Signed)
Sarah Grimes from [facility] in [place] called stating that they do not have the records because they didnt do the referral. The pt was referred by the behavioral health hospital in [place]. The main number to them is [phone]

## 2019-04-25 LAB — COMPREHENSIVE METABOLIC PANEL
ALT: 11 IU/L (ref 0–32)
AST: 18 IU/L (ref 0–40)
Albumin/Globulin Ratio: 1.4 (ref 1.2–2.2)
Albumin: 4 g/dL (ref 3.7–4.7)
Alkaline Phosphatase: 68 IU/L (ref 39–117)
BUN/Creatinine Ratio: 12 (ref 12–28)
BUN: 8 mg/dL (ref 8–27)
Bilirubin Total: 0.2 mg/dL (ref 0.0–1.2)
CO2: 25 mmol/L (ref 20–29)
Calcium: 9.4 mg/dL (ref 8.7–10.3)
Chloride: 103 mmol/L (ref 96–106)
Creatinine, Ser: 0.65 mg/dL (ref 0.57–1.00)
GFR calc Af Amer: 101 mL/min/{1.73_m2} (ref >59)
GFR calc non Af Amer: 88 mL/min/{1.73_m2} (ref >59)
Globulin, Total: 2.9 g/dL (ref 1.5–4.5)
Glucose: 91 mg/dL (ref 65–99)
Potassium: 3.8 mmol/L (ref 3.5–5.2)
Sodium: 142 mmol/L (ref 134–144)
Total Protein: 6.9 g/dL (ref 6.0–8.5)

## 2019-04-25 LAB — SEDIMENTATION RATE: Sed Rate: 11 mm/hr (ref 0–40)

## 2019-04-25 LAB — ANA W/REFLEX: Anti Nuclear Antibody (ANA): NEGATIVE

## 2019-04-25 LAB — TSH: TSH: 0.824 u[IU]/mL (ref 0.450–4.500)

## 2019-04-25 LAB — HEAVY METALS PROFILE II, BLOOD
Arsenic: 7 ug/L (ref 2–23)
Cadmium: NOT DETECTED ug/L (ref 0.0–1.2)
Lead, Blood: NOT DETECTED ug/dL (ref 0–4)
Mercury: NOT DETECTED ug/L (ref 0.0–14.9)

## 2019-04-25 LAB — VITAMIN D 25 HYDROXY (VIT D DEFICIENCY, FRACTURES): Vit D, 25-Hydroxy: 31.1 ng/mL (ref 30.0–100.0)

## 2019-04-25 LAB — B12 AND FOLATE PANEL
Folate: 19.2 ng/mL (ref >3.0)
Vitamin B-12: 659 pg/mL (ref 232–1245)

## 2019-04-25 LAB — RHEUMATOID FACTOR: Rheumatoid fact SerPl-aCnc: <10 IU/mL (ref 0.0–13.9)

## 2019-04-25 LAB — RPR: RPR Ser Ql: NONREACTIVE

## 2019-04-25 LAB — C-REACTIVE PROTEIN: CRP: 2 mg/L (ref 0–10)

## 2019-04-25 LAB — VITAMIN B6: Vitamin B6: 15 ug/L (ref 2.0–32.8)

## 2019-04-25 LAB — VITAMIN B1: Thiamine: 90.4 nmol/L (ref 66.5–200.0)

## 2019-04-26 ENCOUNTER — Telehealth: Payer: Self-pay

## 2019-04-26 NOTE — Telephone Encounter (Signed)
I called pt, spoke to pt's daughter, Glena Norfolk, advised her of pt's lab results and scheduled her EEG. Pt's daughter verbalized understanding of lab results and had no further questions.

## 2019-04-26 NOTE — Progress Notes (Signed)
Labs normal, please update daughter. The only lab that was borderline low was vit D. It may not be a bad idea to start an OTC Vitamin D supplement: 1000-2000 units daily of any vitamin D supplement of her choice should be fine. I would recommend recheck of vitamin D status in 3-6 months with PCP.  As planned, we will proceed with her other tests including MRI brain and EEG. Michel Bickers

## 2019-04-26 NOTE — Telephone Encounter (Signed)
-----   Message from Star Age, MD sent at 04/26/2019  8:07 AM EDT ----- Labs normal, please update daughter. The only lab that was borderline low was vit D. It may not be a bad idea to start an OTC Vitamin D supplement: 1000-2000 units daily of any vitamin D supplement of her choice should be fine. I would recommend recheck of vitamin D status in 3-6 months with PCP.  As planned, we will proceed with her other tests including MRI brain and EEG. Michel Bickers

## 2019-05-12 ENCOUNTER — Other Ambulatory Visit: Payer: Self-pay

## 2019-05-12 ENCOUNTER — Ambulatory Visit (INDEPENDENT_AMBULATORY_CARE_PROVIDER_SITE_OTHER): Payer: Medicare Other | Admitting: Neurology

## 2019-05-12 DIAGNOSIS — F0391 Unspecified dementia with behavioral disturbance: Secondary | ICD-10-CM

## 2019-05-12 DIAGNOSIS — R440 Auditory hallucinations: Secondary | ICD-10-CM

## 2019-05-12 DIAGNOSIS — G3109 Other frontotemporal dementia: Secondary | ICD-10-CM

## 2019-05-12 NOTE — Procedures (Signed)
    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  The patient has required a psychiatric admission for delusions and hallucinations in June 2020.  The patient is being evaluated for this issue.  This is a routine EEG.  No skull defects are noted.  Medications include Haldol.  EEG classification: Normal awake and asleep  Description of the recording: The background rhythms of this recording consists of a fairly well modulated medium amplitude background activity of 9 Hz. As the record progresses, the patient initially is in the waking state, but appears to enter the early stage II sleep during the recording, with rudimentary sleep spindles and vertex sharp wave activity seen. During the wakeful state, photic stimulation is performed, and this results in a bilateral and symmetric photic driving response. Hyperventilation was also performed, and this results in a minimal buildup of the background rhythm activities without significant slowing seen. At no time during the recording does there appear to be evidence of spike or spike wave discharges or evidence of focal slowing. EKG monitor shows no evidence of cardiac rhythm abnormalities with a heart rate of 66.  Impression: This is a normal EEG recording in the waking and sleeping state. No evidence of ictal or interictal discharges were seen at any time during the recording.

## 2019-05-12 NOTE — Progress Notes (Signed)
Please call and advise the patient that the EEG or brain wave test we performed was reported as normal in the awake and asleep states. We checked for abnormal electrical discharges in the brain waves and the report suggested normal findings. No further action is required on this test at this time. Please remind patient to keep any upcoming appointments or tests and to call us with any interim questions, concerns, problems or updates. Thanks,  Matan Steen, MD, PhD  

## 2019-05-13 ENCOUNTER — Telehealth: Payer: Self-pay

## 2019-05-13 NOTE — Telephone Encounter (Signed)
I called pt to discuss her EEG results. No answer, left a message asking her to call me back. 

## 2019-05-13 NOTE — Telephone Encounter (Signed)
I called pt, spoke to Sarah Grimes, pt's daughter, per DPR. I advised her of the EEG results for the pt and subsequent recommendations. Pt verbalized understanding of results. Pt had no questions at this time but was encouraged to call back if questions arise.

## 2019-05-13 NOTE — Telephone Encounter (Signed)
-----   Message from Star Age, MD sent at 05/12/2019  5:05 PM EDT ----- Please call and advise the patient that the EEG or brain wave test we performed was reported as normal in the awake and asleep states. We checked for abnormal electrical discharges in the brain waves and the report suggested normal findings. No further action is required on this test at this time. Please remind patient to keep any upcoming appointments or tests and to call us with any interim questions, concerns, problems or updates. Thanks,  Star Age, MD, PhD

## 2019-05-22 ENCOUNTER — Other Ambulatory Visit: Payer: Self-pay

## 2019-05-22 ENCOUNTER — Ambulatory Visit
Admission: RE | Admit: 2019-05-22 | Discharge: 2019-05-22 | Disposition: A | Discharge status: Home / Self Care | Payer: Medicare Other | Source: Ambulatory Visit | Attending: Neurology | Admitting: Neurology

## 2019-05-22 DIAGNOSIS — G3109 Other frontotemporal dementia: Secondary | ICD-10-CM

## 2019-05-22 MED ORDER — GADOBENATE DIMEGLUMINE 529 MG/ML IV SOLN
12.0000 mL | Freq: Once | INTRAVENOUS | Status: AC | PRN
  Administered 2019-05-22: 13:00:00 12 mL via INTRAVENOUS

## 2019-05-25 NOTE — Progress Notes (Signed)
Please call patient regarding the recent brain MRI: The brain scan showed a normal structure of the brain and no significant volume loss or what we call atrophy, ie, brain looks normal in size for age. There were changes in the deeper structures of the brain, which we call white matter changes or microvascular changes. These were reported as moderate in Her case. These are tiny white spots, that occur with time and are seen in a variety of conditions, including with normal aging, chronic hypertension, chronic headaches, especially migraine HAs, chronic diabetes, chronic hyperlipidemia. These are not strokes and no mass or lesion or contrast enhancement was seen which is reassuring. Again, there were no acute findings, such as a stroke, or mass or blood products. No further action is required on this test at this time, other than re-enforcing the importance of good blood pressure control, good cholesterol control, good blood sugar control, and weight management. Please remind patient to keep any upcoming appointments or tests and to call us with any interim questions, concerns, problems or updates. Thanks,  Star Age, MD, PhD

## 2019-05-26 ENCOUNTER — Telehealth: Payer: Self-pay

## 2019-05-26 NOTE — Telephone Encounter (Signed)
I called pt's daughter, Glena Norfolk, per DPR and discussed pt's MRI results and recommendations with her. Pt's daughter has not heard from Dr. Ferne Coe office and so I gave her their phone number to call. Pt's daughter verbalized understanding of results. Pt's daughter had no questions at this time but was encouraged to call back if questions arise.

## 2019-05-26 NOTE — Telephone Encounter (Signed)
-----   Message from Star Age, MD sent at 05/25/2019  7:51 AM EDT ----- Please call patient regarding the recent brain MRI: The brain scan showed a normal structure of the brain and no significant volume loss or what we call atrophy, ie, brain looks normal in size for age. There were changes in the deeper structures of the brain, which we call white matter changes or microvascular changes. These were reported as moderate in Her case. These are tiny white spots, that occur with time and are seen in a variety of conditions, including with normal aging, chronic hypertension, chronic headaches, especially migraine HAs, chronic diabetes, chronic hyperlipidemia. These are not strokes and no mass or lesion or contrast enhancement was seen which is reassuring. Again, there were no acute findings, such as a stroke, or mass or blood products. No further action is required on this test at this time, other than re-enforcing the importance of good blood pressure control, good cholesterol control, good blood sugar control, and weight management. Please remind patient to keep any upcoming appointments or tests and to call us with any interim questions, concerns, problems or updates. Thanks,  Star Age, MD, PhD

## 2019-05-28 ENCOUNTER — Encounter: Payer: Self-pay | Admitting: Psychology

## 2019-06-02 ENCOUNTER — Other Ambulatory Visit: Payer: Medicare Other

## 2019-06-09 ENCOUNTER — Telehealth: Payer: Self-pay | Admitting: Neurology

## 2019-06-09 NOTE — Telephone Encounter (Signed)
Please advise patient's daughter that I am not the prescriber for the Haldol and I Do not feel comfortable prescribing Haldol for her.  She was supposed to have psychiatric follow-up.  Please advise daughter to ask primary care physician if she had made a referral For psychiatric follow-up.  She was supposed to See a psychiatrist in August from the records I remember seeing from her primary care physician. If there is any acute concern for patient's wellbeing or safety, she will have to be taken to the emergency room.

## 2019-06-09 NOTE — Telephone Encounter (Signed)
I called pt's daughter Glena Norfolk, per DPR. She reports that pt has not completed the neuropsych appt today but will call them today.  Pt's daughter reports that pt flushed her haldol down the toilet two weeks ago and has been "off the chain" since then. Pt's daughter asked pt's PCP for a refill on haldol but they said that neurology should be the ones to refill it. Pt's daughter is asking for an ASAP appt with Dr. Rexene Alberts and for haldol or some other medication to help with pt's hallucinations.

## 2019-06-09 NOTE — Telephone Encounter (Signed)
I called pt's daughter to discuss. No answer, left a message asking her to call me back. 

## 2019-06-09 NOTE — Telephone Encounter (Signed)
Pt daughter(on DPR) has called to inform that about 2 weeks ago pt flushed all her medications down the toilet and has been worsening as a result of being without the medication.  Daughter has scheduled an appointment for pt to be seen by Dr Rexene Alberts but would also like a call from RN to be advised of what Dr Rexene Alberts would suggest since the medication was just filled not that long ago.

## 2019-06-10 NOTE — Telephone Encounter (Signed)
I called pt's Sarah Grimes, per DPR and explained these recommendations. She will contact her PCP regarding the psychiatry referral. Pt's neuro psych appt is in October. Pt's daughter still wants to keep the 9/24 appt with Dr. Rexene Alberts. Pt's daughter verbalized understanding of recommendations.

## 2019-06-28 ENCOUNTER — Telehealth: Payer: Self-pay | Admitting: Neurology

## 2019-06-28 NOTE — Telephone Encounter (Signed)
There is not anything available for tomorrow with Dr. Rexene Alberts. I will check again later this afternoon.

## 2019-06-28 NOTE — Telephone Encounter (Signed)
I called pt's daughter, Glena Norfolk, per DPR. Offered pt an appt tomorrow at 10:30am, which she accepted. Pt verbalized understanding of new appt date and time.

## 2019-06-28 NOTE — Telephone Encounter (Signed)
Patient daughter called stating she was wondering if they could come in tomorrow. I informed her there was not any appointment available she stated she didn't know if there was a work in slot that she could go into. She stated they don't mind to wait. She stated she is off tomorrow and doesn't have coverage at work for Thursday.

## 2019-06-29 ENCOUNTER — Ambulatory Visit (INDEPENDENT_AMBULATORY_CARE_PROVIDER_SITE_OTHER): Payer: Medicare Other | Admitting: Neurology

## 2019-06-29 ENCOUNTER — Encounter: Payer: Self-pay | Admitting: Neurology

## 2019-06-29 ENCOUNTER — Other Ambulatory Visit: Payer: Self-pay

## 2019-06-29 VITALS — BP 135/81 | HR 67 | Ht 64.0 in | Wt 145.0 lb

## 2019-06-29 DIAGNOSIS — Z9183 Wandering in diseases classified elsewhere: Secondary | ICD-10-CM | POA: Diagnosis not present

## 2019-06-29 DIAGNOSIS — F0281 Dementia in other diseases classified elsewhere with behavioral disturbance: Secondary | ICD-10-CM

## 2019-06-29 DIAGNOSIS — R443 Hallucinations, unspecified: Secondary | ICD-10-CM | POA: Diagnosis not present

## 2019-06-29 DIAGNOSIS — F02818 Dementia in other diseases classified elsewhere, unspecified severity, with other behavioral disturbance: Secondary | ICD-10-CM

## 2019-06-29 NOTE — Patient Instructions (Addendum)
I am not convinced you have Alzheimer's dementia.  Your brain MRI did not show any significant volume loss or what we call atrophy.  In fact, your head CT from June and brain MRI from August do not support a demented illness such as Alzheimer's disease or a rare dementive disease called frontotemporal dementia.  Your examination does not suggest any parkinsonism.   We will continue to monitor your memory scores but for now I cannot think of any additional testing from my end of things.  Please keep your psychiatric appointment in October and your neuropsychological appointment for December.  I will see you after you have had a chance to see Dr. Sima Matas.  Please follow-up with your primary care physician in the meantime.  We have actually done extensive blood work in July 2020 so we do not have to do any additional blood work from my end of things today.  You have lost about 4 pounds since your appointment with me in July 2020.  Please continue to try to hydrate well with water and make sure you eat nutritious food on a regular basis.  Please continue with 24-7 supervision.   You can try Melatonin at night for sleep: take 1 mg to 3 mg, one to 2 hours before your bedtime. You can go up to 5 mg if needed. It is over the counter and comes in pill form, chewable form and spray, if you prefer.

## 2019-06-29 NOTE — Progress Notes (Signed)
Subjective:    Patient ID: Sarah Grimes is a 73 y.o. female.  HPI     Interim history:   Sarah Grimes is a 73 year old right-handed woman with an underlying medical history of hypertension, hyperlipidemia, cataracts, osteoarthritis, diabetes, and reflux disease, who presents for follow-up consultation of her memory loss.  The patient is accompanied by her daughter again today.  I first met her on 04/21/2019 at the request of her primary care physician, at which time there was concern for dementia with behavioral disturbance.  She had a recent behavioral admission in June 2020.  She was supposed to follow-up with psychiatry as an outpatient.  She was on Haldol at the time.  Her MMSE was 22 out of 30 at the time.  I suggested further testing/evaluation in the form of EEG, brain MRI and neuropsychological evaluation.  She has not had her cognitive evaluation done.  She had a brain MRI with and without contrast on 05/22/2019 and I reviewed the results: IMPRESSION: This MRI of the brain with and without contrast shows the following: 1.    Brain volume is normal for age. 2.    Subcortical and deep white matter foci most consistent with moderate chronic microvascular ischemic change.  None of these appear to be acute. 3.    There are no acute findings and there is a normal enhancement pattern.  She had an EEG on 05/12/2019 and I reviewed the results: Impression: This is a normal EEG recording in the waking and sleeping state. No evidence of ictal or interictal discharges were seen at any time during the recording.   We called her with the test results.   Her daughter called in the interim on 06/09/2019 reporting behavioral concerns.  Her PCP would not fill her Haldol.  The patient's daughter was advised that the primary care physician would have to facilitate psychiatric referral if it had not been completed.  She was supposed to see a psychiatrist as an outpatient and follow-up as recommended by her  inpatient stay and as documented in her primary care physician's records that I was able to review prior to her first visit.  She has neuropsychological evaluation pending with Dr. Sima Matas.  Today, 06/29/2019: The patient is not providing any history.  She is minimally verbal.  She is very hard of hearing.  Daughter reports that patient continues to see people and hear voices.  She has wandered outside the house.  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  She fell this past weekend because of a obstacle, she landed on her buttocks and did not hurt herself thankfully.  She did not hit her head.  She has not had any one-sided weakness or changes in her facial expression or speech changes.  She continues to be on Haldol 0.5 mg twice daily.  She has not seen a psychiatrist since her behavioral admission.  She has an appointment with psychiatry as I understand in October and she has an appointment with Dr. Sima Matas in December.  Her daughter is worried that patient is not eating well.  She eats very little, she does not hydrate well with water.  She does not sleep well.  Daughter has been giving her Tylenol PM.  The patient's allergies, current medications, family history, past medical history, past social history, past surgical history and problem list were reviewed and updated as appropriate.   Previously:   04/21/2019: (She) has had altered mental status recently, and a psychiatric admission warranted secondary  to delusions and hallucinations in June 2020.  I was able to review some hospital records, and we requested your office records as well.  She was admitted on 03/14/2021 [facility] psychiatric unit secondary to delusions and hallucinations.  Her symptoms started in October.  She has had auditory hallucinations primarily, she also had paranoid delusions, no premorbid mood disorder.  In the hospital, she was treated with IM Benadryl as needed, Colace as needed,  haloperidol 5 mg every 4 hours as needed as well as IM Haldol, hydroxyzine 50 mg every 4 hours as needed and IM Ativan 2 mg strength every 6 hours as needed as well as Ambien 5 mg strength nightly as needed.  She was discharged from her psychiatric admission on 03/26/19, per daughter. She was kept on Haldol only.  She is no longer on her reflux or cholesterol medicine or blood pressure medicine or baby aspirin.  I reviewed your office notes as well from 01/07/2018, 04/14/2018, 07/16/2018, 01/21/2019, at which time she had blood work including A1c, CMP, CBC with differential, lipid profile, vitamin D.  She was advised to seek help with her hearing aids at the time.  I reviewed her blood test results, A1c was 6.0, LDL 105, AST 21, ALT 14, creatinine 0.65, BUN 11.  She had a follow-up on 02/12/2019.  At that time her daughter was concerned about behavioral changes in her mother over the past year, she was having visual hallucinations.  She had a follow-up on 02/17/2019.  She had memory changes.  She had ongoing auditory hallucinations, daughter had mentioned cognitive changes for about 6 months at the time including misplacing things.  She had a follow-up on 03/30/2019.  Daughter reports forgetfulness and patient has been misplacing things.  The patient has 1 daughter and 1 son.  Son lives in [place], daughter in [place].  Since her hospital discharge they have been taking turns to stay with her overnight, she is not alone at night.  Daughter reports ongoing issues with delusions and the neighbors that she was concerned about have not been staying there, have moved away a while ago.  The patient does insist that these are not hallucinations and these is talking to.  She is currently very cooperative and able to participate in the history but daughter reports that patient has had some anger outburst as well.  She is not sleeping very well.  She takes Haldol 3 times a day.  She has an appointment with a psychiatrist next  month.  She has not been driving very much but has driven since her hospital discharge to her daughter's house.  She is a non-smoker and does not utilize alcohol and drinks caffeine in the form of sweet tea, about 2 glasses/day on average.  She has been from a larger family, she had a total of 6 brothers, one passed away, she had 5 sisters, 2 passed away.  None of her siblings had any memory issues or have any memory loss.  Her father apparently had some memory issues in the later years of his life, passed away in his 66s.  Her Past Medical History Is Significant For: Past Medical History:  Diagnosis Date  . Cataract, bilateral   . GERD (gastroesophageal reflux disease)   . Hallux valgus, left   . High cholesterol   . Osteoarthritis     Her Past Surgical History Is Significant For: Past Surgical History:  Procedure Laterality Date  . ABDOMINAL HYSTERECTOMY  Her Family History Is Significant For: History reviewed. No pertinent family history.  Her Social History Is Significant For: Social History   Socioeconomic History  . Marital status: Single    Spouse name: Not on file  . Number of children: Not on file  . Years of education: Not on file  . Highest education level: Not on file  Occupational History  . Not on file  Social Needs  . Financial resource strain: Not on file  . Food insecurity    Worry: Not on file    Inability: Not on file  . Transportation needs    Medical: Not on file    Non-medical: Not on file  Tobacco Use  . Smoking status: Never Smoker  . Smokeless tobacco: Never Used  Substance and Sexual Activity  . Alcohol use: Never    Frequency: Never  . Drug use: Not on file  . Sexual activity: Not on file  Lifestyle  . Physical activity    Days per week: Not on file    Minutes per session: Not on file  . Stress: Not on file  Relationships  . Social Herbalist on phone: Not on file    Gets together: Not on file    Attends religious  service: Not on file    Active member of club or organization: Not on file    Attends meetings of clubs or organizations: Not on file    Relationship status: Not on file  Other Topics Concern  . Not on file  Social History Narrative  . Not on file    Her Allergies Are:  No Known Allergies:   Her Current Medications Are:  Outpatient Encounter Medications as of 06/29/2019  Medication Sig  . haloperidol (HALDOL) 0.5 MG tablet Take 0.5 mg by mouth 2 (two) times daily.   . haloperidol (HALDOL) 1 MG tablet Take 1 mg by mouth at bedtime.    No facility-administered encounter medications on file as of 06/29/2019.   :  Review of Systems:  Out of a complete 14 point review of systems, all are reviewed and negative with the exception of these symptoms as listed below: Review of Systems  Neurological:       Pt presents today for follow up. She has not completed neuro psych testing nor has seen a geriatric psychiatrist. Pt's daughter is complaining that pt is wandering away from her house and still having visual and auditory hallucinations.    Objective:  Neurological Exam  Physical Exam Physical Examination:   Vitals:   06/29/19 1043  BP: 135/81  Pulse: 67   General Examination: The patient is a very pleasant 73 y.o. female in no acute distress. She appears well-developed and well-nourished and well groomed. Minimally verbal, very hard of hearing.   HEENT: Normocephalic, atraumatic, pupils are equal, round and reactive to light, tracking is preserved, hearing is very impaired.  She has no significant facial masking, she has normal facial symmetry, airway examination reveals mild to moderate mouth dryness, tongue protrudes centrally in palate elevates symmetrically.  No involuntary abnormal movements of the tongue or mouth.  Neck is supple with no nuchal rigidity noted.  Chest: Clear to auscultation without wheezing, rhonchi or crackles noted.  Heart: S1+S2+0, regular and normal  without murmurs, rubs or gallops noted.   Abdomen: Soft, non-tender and non-distended with normal bowel sounds appreciated on auscultation.  Extremities: There is trace pitting edema in the R ankle, stable.   Skin: Warm and  dry without trophic changes noted.   Musculoskeletal: exam reveals no obvious joint deformities, tenderness or joint swelling or erythema.   Neurologically:  Mental status: The patient is awake, alert, but Not able to provide her history.  She is very hard of hearing and is minimally verbal today. No obvious anxiety, no obvious hallucinations.  On 04/21/2019: MMSE: 22/30 (She missed 2 points on Orientation, 4 points on serial 7, 1 point on recall, 1 point on copying the figure.), CDT: 3/4, AFT: 7/min.  Cranial nerves II - XII are as described above under HEENT exam. In addition: shoulder shrug is normal with equal shoulder height noted. Motor exam: Normal bulk, strength and tone is noted. There is no tremor. Reflexes are 2+ throughout. Fine motor skills and coordination: grossly intact.  Cerebellar testing: No dysmetria or intention tremor on finger to nose testing. Heel to shin is unremarkable bilaterally. There is no truncal or gait ataxia.  Sensory exam: intact to light touch in the upper and lower extremities.  Gait, station and balance: She stands easily. No veering to one side is noted. No leaning to one side is noted. Posture is age-appropriate and stance is narrow based. Gait shows normal stride length and normal pace. No problems turning are noted. No limp, no shuffling.             Assessment and Plan:  Assessment and Plan:  In summary, Sarah Grimes is a very pleasant 73 year old female with an underlying medical history of hypertension, hyperlipidemia, cataracts, osteoarthritis, diabetes, and reflux disease, who presents for Follow-up consultation of her memory loss.  She has ongoing issues with hallucinations and delusions and is wandered off.  She  does have 24-7 supervision at the house.  She had a behavioral admission in the summer and was discharged on Haldol, she continues to take it.  She was supposed to have an interim outpatient psychiatric follow-up but for some reason this has not been accomplished yet.  She has an appointment with psychiatry next month as I understand.  I referred her for formal cognitive evaluation and neuropsychological evaluation to Dr. Sima Matas and this appointment is pending in December.  Unfortunately, she still suffers from significant behavioral changes including distraction, confusion, hallucinations, delusions and sleep disturbance as well as appetite loss and she also does not hydrate well.  She does not sleep well.  The daughter is discouraged from utilizing Benadryl or p.m. type medications.  She is encouraged to try her on melatonin.  I did not suggest any medication for dementia quite yet.  Her brain scan did not suggest any significant volume loss such as can be seen in frontotemporal dementia or Alzheimer's dementia.  Her physical exam is not supportive of Lewy body dementia although she does have prominent hallucinations.  Nevertheless, I explained to the daughter that a trial of a dementia medication such as Aricept may escalate her behavioral disturbance and even her hallucinations and I would be rather reluctant to introduce a new medication at this juncture.  Her history and Brain scan are not supportive of Alzheimer's dementia.  She has no prominent frontotemporal atrophy.She has white matter changes on the brain scan in the moderate range.  Nevertheless, her symptomatology is not classic for vascular dementia either.  We did extensive blood work in July.  We also did an EEG in the interim, all nonrevealing.  She is advised to follow-up with her primary care physician and keep her appointment with psychiatry and neuropsychology and I will  see her back after that. We talked about the importance of good  nutrition and good hydration.  We also talked about the importance of safety at home and full supervision.  The daughter appears to be doing the best she can and I commended her for it.  She is advised to dCall with any interim questions or concern regarding the patient.  I answered all their questions today and the patient and her daughter were in agreement. I spent 30 minutes in total face-to-face time with the patient, more than 50% of which was spent in counseling and coordination of care, reviewing test results, reviewing medication and discussing or reviewing the diagnosis of Memory loss with behavioral disturbance, the prognosis and treatment options. Pertinent laboratory and imaging test results that were available during this visit with the patient were reviewed by me and considered in my medical decision making (see chart for details).

## 2019-07-01 ENCOUNTER — Ambulatory Visit: Payer: Medicare Other | Admitting: Neurology

## 2019-07-28 ENCOUNTER — Ambulatory Visit: Payer: Self-pay | Admitting: Neurology

## 2019-09-16 ENCOUNTER — Encounter: Payer: Medicare Other | Attending: Psychology | Admitting: Psychology

## 2019-09-16 ENCOUNTER — Other Ambulatory Visit: Payer: Self-pay

## 2019-09-16 ENCOUNTER — Encounter: Payer: Self-pay | Admitting: Psychology

## 2019-09-16 DIAGNOSIS — R413 Other amnesia: Secondary | ICD-10-CM | POA: Diagnosis not present

## 2019-09-16 DIAGNOSIS — R440 Auditory hallucinations: Secondary | ICD-10-CM | POA: Diagnosis not present

## 2019-09-16 DIAGNOSIS — F22 Delusional disorders: Secondary | ICD-10-CM

## 2019-09-16 NOTE — Progress Notes (Signed)
Neuropsychological Consultation   Patient:   Sarah Grimes   DOB:   [birth year]  MR Number:  [id number]  Location:  [facility] FOR PAIN AND [facility] MEDICINE [facility] PHYSICAL MEDICINE AND REHABILITATION [address] [place], [address] [id number] [facility] [place] [address] Dept: [phone]           Date of Service:   09/16/2019  Start Time:   2 PM End Time:   4 PM  Today's visit was an in person visit that consisted of myself, the patient and her daughter.  1 hour spent is an in person clinical interview with the second hour being records review and report writing.  Provider/Observer:  Arley Phenix, Psy.D.       Clinical Neuropsychologist       Billing Code/Service: [id number], [phone]  Chief Complaint:    Sarah Grimes is a 73 year old female that was referred by Huston Foley, MD for neuropsychological evaluation.  The patient has a history of underlying medical issues including hypertension, hyperlipidemia, cataracts, osteoarthritis, diabetes and reflux disease.  The patient initially presented with issues related to memory loss and concerns for dementia with behavioral disturbance.  The patient was recently admitted for behavioral health hospitalization due to behavioral disturbance in June 2020.  The patient has developed psychosis with hallucinations.  While the patient does not personally acknowledge that anything is wrong with her the patient's daughter reports that the patient has been wandering around her house constantly and will wander outside if she is not being watched constantly.  The patient has active hallucinations both visual and auditory and significant delusions.  The patient will talk to "ghost" and will go to the extent of buying things for them including food and will constantly talk to these people and described in the staff or other friends or people she knows.  The patient thinks that the back entrance to her apartment is a completely different  location than the front entrance to her same apartment.  There is a loss of inhibition and behavioral change and the patient is used language that was not common for her in the past.  These hallucinations/delusions started in late 2019 and there were never symptoms like this.  Initially they are very bizarre but they have turned into extreme behavior such as wandering in conversations with "ghost"'s.  Reason for Service:  Sarah Grimes is a 73 year old female that was referred by Huston Foley, MD for neuropsychological evaluation.  The patient has a history of underlying medical issues including hypertension, hyperlipidemia, cataracts, osteoarthritis, diabetes and reflux disease.  The patient initially presented with issues related to memory loss and concerns for dementia with behavioral disturbance.  The patient was recently admitted for behavioral health hospitalization due to behavioral disturbance in June 2020.  The patient has developed psychosis with hallucinations.  While the patient does not personally acknowledge that anything is wrong with her the patient's daughter reports that the patient has been wandering around her house constantly and will wander outside if she is not being watched constantly.  The patient has active hallucinations both visual and auditory and significant delusions.  The patient will talk to "ghost" and will go to the extent of buying things for them including food and will constantly talk to these people and described in the staff or other friends or people she knows.  The patient thinks that the back entrance to her apartment is a completely different location than the front entrance to her same apartment.  There is a loss of inhibition and behavioral change and the patient is used language that was not common for her in the past.  These hallucinations/delusions started in late 2019 and there were never symptoms like this.  Initially they are very bizarre but they have turned  into extreme behavior such as wandering in conversations with "ghost"'s.  The patient's daughter reports that there have never been symptoms like this with the patient before she has no history of schizophrenia or other psychiatric disorder.  The patient continues to be very forgetful and loses things all over her house and has lost 2 sets of hearing aids along with many other things.  The patient reports that her legs feel weak and tired all the time but she walks around her apartment from sun up to sundown.  However, the patient never goes out by herself because of concerns that she will wander off.  The patient will asked to be taken down to the end of the street to someone's house that she thinks she knows but when her daughter takes her there the people that live at the house do not know of anyone with the name.  The patient constantly thinks that her friends will "pick her up and take her places."  When the patient is away from her home she will constantly worried that her house is on fire or someone is breaking into her house and taking her stuff.  The patient has had an MRI recently with the impression being brain volume is normal for age with subcortical and deep white matter foci most consistent with moderate chronic microvascular ischemic changes with no apparent acute symptoms.  The patient is described of having events in the past that may be consistent with TIA but there have been no full stroke events.  The patient also had a recent EEG in August of this year with a normal EEG recording in waking and sleeping state.  Behavioral concerns continue in the continue to become more pronounced delusional ideation.  The patient is very hard of hearing but hears her auditory hallucinations quite easily.  The patient entered family members denies any evidence of any tremors or other motor symptoms except for during the recent TIA like event that has fully resolved.   Reliability of Information: The  information is derived from 1 hour face-to-face clinical interview with the patient and her daughter as well as review of available medical records.  Behavioral Observation: Sarah Grimes  presents as a 73 y.o.-year-old Right African American Female who appeared her stated age. her dress was Appropriate and she was Well Groomed and her manners were Appropriate to the situation.  her participation was indicative of Appropriate, Redirectable and But very hard of hearing behaviors.  There were not any physical disabilities noted.  she displayed an appropriate level of cooperation and motivation.     Interactions:    Minimal Appropriate and Redirectable  Attention:   abnormal and attention span appeared shorter than expected for age  Memory:   abnormal; remote memory intact, recent memory impaired  Visuo-spatial:  not examined  Speech (Volume):  low  Speech:   normal; normal  Thought Process:  Tangential  Though Content:  Delusions; hallucinations  Orientation:   person  Judgment:   Poor  Planning:   Poor  Affect:    Appropriate  Mood:    Euthymic  Insight:   Lacking  Intelligence:   normal  Marital Status/Living: The patient was [place] with 11 siblings.  There were no significant childhood issues.  The patient currently lives by herself but has family members including her brothers and daughters alternate overnight stays and there was somebody there all day and night to help with the patient.  The patient has never been married.  The patient has a 51 year old daughter as well as a 1 year old son.  Current Employment: The patient is retired.  Past Employment:  The patient worked for many years with [employer] in [employer].  Substance Use:  No concerns of substance abuse are reported.    Education:   HS Graduate  Medical History:   Past Medical History:  Diagnosis Date  . Cataract, bilateral   . GERD  (gastroesophageal reflux disease)   . Hallux valgus, left   . High cholesterol   . Osteoarthritis         Psychiatric History:  The patient has no prior psychiatric history but has developed psychosis/delusional symptoms and was recently hospitalized in the psychiatric hospital because of her delusions.  The patient is also had transient ischemic event that affected the left side of her body.  No residual symptoms from that.  Family Med/Psych History: History reviewed. No pertinent family history.  Risk of Suicide/Violence: low the patient denies any suicidal or homicidal ideation.  Impression/DX:  Sarah Grimes is a 73 year old female that was referred by Star Age, MD for neuropsychological evaluation.  The patient has a history of underlying medical issues including hypertension, hyperlipidemia, cataracts, osteoarthritis, diabetes and reflux disease.  The patient initially presented with issues related to memory loss and concerns for dementia with behavioral disturbance.  The patient was recently admitted for behavioral health hospitalization due to behavioral disturbance in June 2020.  The patient has developed psychosis with hallucinations.  While the patient does not personally acknowledge that anything is wrong with her the patient's daughter reports that the patient has been wandering around her house constantly and will wander outside if she is not being watched constantly.  The patient has active hallucinations both visual and auditory and significant delusions.  The patient will talk to "ghost" and will go to the extent of buying things for them including food and will constantly talk to these people and described in the staff or other friends or people she knows.  The patient thinks that the back entrance to her apartment is a completely different location than the front entrance to her same apartment.  There is a loss of inhibition and behavioral change and the patient is used language  that was not common for her in the past.  These hallucinations/delusions started in late 2019 and there were never symptoms like this.  Initially they are very bizarre but they have turned into extreme behavior such as wandering in conversations with "ghost"'s.   Disposition/Plan:  We have set the patient up for formal neuropsychological testing.  Initially we will administer the RBANS neuropsychological test battery and then make a determination as to any other neuropsychological measures to be included.  Diagnosis:    Auditory hallucinations  Delusions ([place])  Memory loss         Electronically Signed   _______________________ Ilean Skill, Psy.D.

## 2019-09-20 ENCOUNTER — Encounter: Payer: Medicare Other | Admitting: Psychology

## 2019-09-20 ENCOUNTER — Encounter: Payer: Self-pay | Admitting: Psychology

## 2019-09-27 ENCOUNTER — Encounter: Payer: Medicare Other | Admitting: Psychology

## 2019-09-27 ENCOUNTER — Other Ambulatory Visit: Payer: Self-pay

## 2019-09-27 ENCOUNTER — Encounter: Payer: Self-pay | Admitting: Psychology

## 2019-09-27 DIAGNOSIS — R440 Auditory hallucinations: Secondary | ICD-10-CM | POA: Diagnosis not present

## 2019-09-27 DIAGNOSIS — R413 Other amnesia: Secondary | ICD-10-CM

## 2019-09-27 NOTE — Progress Notes (Signed)
The patient arrived around 10 minutes late to her 13:00 testing appointment and was accompanied by her daughter. The evaluation lasted 120 minutes. Behavioral Observations:  Appearance: Casually and appropriately dressed and groomed.  Gait: Ambulated independently without assistance but w/slow pace and hunched back.  Reduced balance and coordination.  Speech: Incoherent at times, reduced rate, normal tone & volume Thought process: Fluctuating cognitive functioning, disorganized, perseverative, & confused.  Mood/Affect: Mostly euthymic but somewhat depressed. Broad range.  Interpersonal: Mostly polite and appropriate Orientation: Mostly oriented (e.g. wrong date, correct year/month)  Effort/Motivation: Adequate  She had significant trouble hearing and understanding test instructions and required much additional prompting. She exhibited mildly reduced frustration tolerance on questions she did not know or tasks that were more difficult. She appeared to get fatigued after around 40 minutes and here general arousal/attention levels began to fluctuate. rapidly at times. She would typically respond after a few attempts to call out her name but was more difficult to alert at times. It took a significant amount of effort to complete the battery.  Tests Administered: . Repeatable Battery for the Assessment of Neuropsychological Status, Update, Form A  . Wide Range Achievement Test, 5th Edition o Word Reading  Results  Subtest/Composite Raw Score Standard Score 90% Confidence Interval Percentile Rank Descriptive Category Grade Equivalent Growth Scale Value  Word Reading 34 66 60 - 72 1 Extremely Low 2.4 468   Measure Standard or Scaled Score Percentile Description  Immediate Memory 44 <0.1 Impaired  List Learning 2 <1 Moderately Impaired  Story Memory 1 <1 Impaired  Visuospatial/Constructional 60 0.4 Impaired  Figure Copy 4 2 Borderline  Line Orientation - ?2 Impaired  Language 60 0.4 Impaired   Picture Naming - 3-9 Borderline-Low Average  Semantic Fluency 2 <1 Moderately Impaired  Attention 46 <0.1 Impaired  Digit Span 3 1 Mildly Impaired  Coding 1 <1 Impaired  Delayed Memory 44 <0.1 Impaired  List Recall - ?2 Impaired  List Recognition - ?2 Impaired  Story Recall 1 <1 Impaired  Figure Recall 4 2 Borderline  Total Score 46 <0.1 Impaired

## 2019-10-04 ENCOUNTER — Ambulatory Visit: Payer: Medicare Other | Admitting: Psychology

## 2019-10-14 ENCOUNTER — Encounter: Payer: Self-pay | Admitting: Psychology

## 2019-10-14 ENCOUNTER — Encounter: Payer: Medicare PPO | Attending: Psychology | Admitting: Psychology

## 2019-10-14 ENCOUNTER — Other Ambulatory Visit: Payer: Self-pay

## 2019-10-14 DIAGNOSIS — F0281 Dementia in other diseases classified elsewhere with behavioral disturbance: Secondary | ICD-10-CM

## 2019-10-14 DIAGNOSIS — F22 Delusional disorders: Secondary | ICD-10-CM | POA: Diagnosis present

## 2019-10-14 DIAGNOSIS — R413 Other amnesia: Secondary | ICD-10-CM | POA: Insufficient documentation

## 2019-10-14 DIAGNOSIS — R440 Auditory hallucinations: Secondary | ICD-10-CM | POA: Diagnosis not present

## 2019-10-14 DIAGNOSIS — G3183 Dementia with Lewy bodies: Secondary | ICD-10-CM

## 2019-10-14 NOTE — Progress Notes (Addendum)
Patient:  Sarah Grimes   DOB: 18-Oct-1945  MR Number: [id number]  Location: [facility] FOR PAIN AND REHABILITATIVE MEDICINE [facility] PHYSICAL MEDICINE AND REHABILITATION [address] [place], [address] [id number] [facility] [place] [address] Dept: [phone]  Start: 9 AM End: 10 AM  Provider/Observer:     Hershal Coria PsyD  Chief Complaint:      Chief Complaint  Patient presents with  . Memory Loss  . Hallucinations  . Other    Auditory hallucinations and delusions.    Reason For Service:     Malisha R. Kalmar is a 74 year old female that was referred by Huston Foley, MD for neuropsychological evaluation.  The patient has a history of underlying medical issues including hypertension, hyperlipidemia, cataracts, osteoarthritis, diabetes and reflux disease.  The patient initially presented with issues related to memory loss and concerns for dementia with behavioral disturbance.  The patient was recently admitted for behavioral health hospitalization due to behavioral disturbance in June 2020.  The patient has developed psychosis with hallucinations.  While the patient does not personally acknowledge that anything is wrong with her, the patient's daughter reports that the patient has been wandering around her house constantly and will wander outside if she is not being watched constantly.  The patient has active hallucinations both visual and auditory and significant delusions.  The patient will talk to "ghost" and will go to the extent of buying things for them including food and will constantly talk to these people or other friends or people she knows.  The patient thinks that the back entrance to her apartment is a completely different location than the front entrance to her same apartment.  There is a loss of inhibition and behavioral change and the patient has used language that was not common for her in the past.  These hallucinations/delusions started in late 2019 and  there were never symptoms like this.  Initially they are very bizarre but they have turned into extreme behavior such as wandering and having conversations with "ghost"'s.  The patient's daughter reports that there have never been symptoms like this with the patient before.  She has no history of schizophrenia or other psychiatric disorder.  The patient continues to be very forgetful and loses things all over her house and has lost 2 sets of hearing aids along with many other things.  The patient reports that her legs feel weak and tired all the time but she walks around her apartment from sun up to sundown.  The patient never goes out by herself because of concerns that she will wander off.  The patient will ask to be taken down to the end of the street to someone's house that she thinks she knows but when her daughter takes her there the people that live at the house do not know the patient.  The patient constantly thinks that her friends will "pick her up and take her places."  When the patient is away from her home she will constantly worried that her house is on fire or someone is breaking into her house and taking her stuff.  The patient has had an MRI recently with the impression being brain volume is normal for age with subcortical and deep white matter foci most consistent with moderate chronic microvascular ischemic changes with no apparent acute symptoms.  The patient is described of having events in the past that may be consistent with TIA but there have been no full stroke events.  The patient  also had a recent EEG in August of this year with a normal EEG recording in waking and sleeping state.  Behavioral concerns continue in the continue to become more pronounced delusional ideation.  The patient is very hard of hearing but hears her auditory hallucinations quite easily.  The patient as well as family members deny any evidence of any tremors or other motor symptoms except for during the  recent TIA like event that has fully resolved.  Behavioral Observations: Appearance:Casually and appropriately dressedand groomed.  Gait: Ambulated independently without assistance but w/slow pace and hunched back.  Reduced balance and coordination.  Speech:Incoherent at times, reduced rate, normal tone & volume Thought process:Fluctuating cognitive functioning, disorganized, perseverative, & confused.  Mood/Affect:Mostly euthymic but somewhat depressed. Broad range.  Interpersonal: Mostly polite and appropriate Orientation: Mostly oriented (e.g. wrong date, correct year/month)  Effort/Motivation: Adequate  She had significant trouble hearing and understanding test instructions and required much additional prompting. She exhibited mildly reduced frustration tolerance on questions she did not know or tasks that were more difficult. She appeared to get fatigued after around 40 minutes and here general arousal/attention levels began to fluctuate. rapidly at times. She would typically respond after a few attempts to call out her name but was more difficult to alert at times. It took a significant amount of effort to complete the battery.  Tests Administered:  Repeatable Battery for the Assessment of Neuropsychological Status, Update, Form A   Wide Range Achievement Test, 5th Edition ? Word Reading   Test Results:     Results  Subtest/Composite Raw Score Standard Score 90% Confidence Interval Percentile Rank Descriptive Category Grade Equivalent Growth Scale Value  Word Reading 34 66 60 - 72 1 Extremely Low 2.4 468   Measure Standard or Scaled Score Percentile Description  Immediate Memory 44 <0.1 Impaired  List Learning 2 <1 Moderately Impaired  Story Memory 1 <1 Impaired  Visuospatial/Constructional 60 0.4 Impaired  Figure Copy 4 2 Borderline  Line Orientation - ?2 Impaired  Language 60 0.4 Impaired  Picture Naming - 3-9 Borderline-Low Average  Semantic Fluency 2 <1  Moderately Impaired  Attention 46 <0.1 Impaired  Digit Span 3 1 Mildly Impaired  Coding 1 <1 Impaired  Delayed Memory 44 <0.1 Impaired  List Recall - ?2 Impaired  List Recognition - ?2 Impaired  Story Recall 1 <1 Impaired  Figure Recall 4 2 Borderline  Total Score 46 <0.1 Impaired    Summary of Results:   The patient performed in the severely impaired range on every measure administered with especially impaired deficits with regard to attention and concentration memory and learning and to a lesser degree visual spatial and constructional and expressive language functioning.  However, they were all in the significantly to severely impaired range.  Some of these deficits could be attributed to her severe hearing deficits but great care was taken to make sure she understood the task at hand and was able to hear the instructions accurately.  While the patient had mildly reduced frustration tolerance she became significantly fatigued around the 40-minute mark of the testing procedures and general arousal and attention levels began to fluctuate significantly.  Impression/Diagnosis:   Combining the objective findings with the recent neuropsychological evaluation along with neurological information and neuro imaging/EEG as well as clinical data related to the course of the development of her symptoms and specific subjective symptoms themselves, the most likely diagnostic consideration at this point would be Lewy body dementia.  While there are some abnormalities on  her MRI, they would not explain the development and severity of her symptoms given the indications of mild and/or normal aging cerebrovascular changes.  The patient's auditory and visual hallucinations and delusions are very pronounced.  However, the patient has no history of these types of symptoms prior to the recent development and need for hospitalization back in June.  Again, the diagnostic picture appears to be most consistent with Lewy body  dementia.  It will be important for the family to make environmental changes and safety related changes around the patient's environment as this condition is likely to continue to progress and worsen.  Living arrangements, safety concerns and financial/legal aspects should all be addressed.  I will be providing feedback regarding the results of the current neuropsychological evaluation to the patient and her daughter.  This appointment is scheduled to occur on November 18, 2019.  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 Neuropsychologist

## 2019-10-26 ENCOUNTER — Encounter: Payer: Medicare PPO | Admitting: Psychology

## 2019-10-26 ENCOUNTER — Other Ambulatory Visit: Payer: Self-pay

## 2019-11-02 ENCOUNTER — Ambulatory Visit: Payer: Medicare Other | Admitting: Neurology

## 2019-11-04 ENCOUNTER — Encounter (HOSPITAL_BASED_OUTPATIENT_CLINIC_OR_DEPARTMENT_OTHER): Payer: Medicare PPO | Admitting: Psychology

## 2019-11-04 ENCOUNTER — Encounter: Payer: Self-pay | Admitting: Psychology

## 2019-11-04 ENCOUNTER — Other Ambulatory Visit: Payer: Self-pay

## 2019-11-04 DIAGNOSIS — F0281 Dementia in other diseases classified elsewhere with behavioral disturbance: Secondary | ICD-10-CM

## 2019-11-04 DIAGNOSIS — F22 Delusional disorders: Secondary | ICD-10-CM | POA: Diagnosis not present

## 2019-11-04 DIAGNOSIS — G3183 Dementia with Lewy bodies: Secondary | ICD-10-CM

## 2019-11-04 DIAGNOSIS — R440 Auditory hallucinations: Secondary | ICD-10-CM

## 2019-11-04 DIAGNOSIS — F02818 Dementia in other diseases classified elsewhere, unspecified severity, with other behavioral disturbance: Secondary | ICD-10-CM

## 2019-11-04 NOTE — Progress Notes (Signed)
Today I provided feedback regarding the results of the recent neuropsychological evaluation.  The complete report can be found in the patient's electronic medical records dated 10/14/2019.  Today's visit was a telemedicine visit conducted through WebEx with good audio and video quality throughout.  I was present in my outpatient clinic office and the patient and her daughter were present in the patient's home.  We reviewed the results and implications of the current diagnostic considerations of Lewy body dementia with behavioral disturbance and talked about treatment goals to try to maintain quality of life and independence.  There are a number of family members that can provide close supervision 24 hours a day which will likely become increasingly needed.  The family would like to keep the patient at home with the family as long as possible.  Below you will find the summary and impressions/interpretation from the formal evaluation.  The patient's evaluation is available in her epic records now and is available to both her PCP/internal medicine physician as well as her referring neurologist.  The patient's daughter is working on rescheduling a follow-up appointment with her neurologist to go over treatment options even though they are limited.    Summary of Results:                        The patient performed in the severely impaired range on every measure administered with especially impaired deficits with regard to attention and concentration memory and learning and to a lesser degree visual spatial and constructional and expressive language functioning.  However, they were all in the significantly to severely impaired range.  Some of these deficits could be attributed to her severe hearing deficits but great care was taken to make sure she understood the task at hand and was able to hear the instructions accurately.  While the patient had mildly reduced frustration tolerance she became significantly fatigued  around the 40-minute mark of the testing procedures and general arousal and attention levels began to fluctuate significantly.  Impression/Diagnosis:                     Combining the objective findings with the recent neuropsychological evaluation along with neurological information and neuro imaging/EEG as well as clinical data related to the course of the development of her symptoms and specific subjective symptoms themselves, the most likely diagnostic consideration at this point would be Lewy body dementia.  While there are some abnormalities on her MRI, they would not explain the development and severity of her symptoms given the indications of mild and/or normal aging cerebrovascular changes.  The patient's auditory and visual hallucinations and delusions are very pronounced.  However, the patient has no history of these types of symptoms prior to the recent development and need for hospitalization back in June.  Again, the diagnostic picture appears to be most consistent with Lewy body dementia.  It will be important for the family to make environmental changes and safety related changes around the patient's environment as this condition is likely to continue to progress and worsen.  Living arrangements, safety concerns and financial/legal aspects should all be addressed.  I will be providing feedback regarding the results of the current neuropsychological evaluation to the patient and her daughter.  This appointment is scheduled to occur on November 18, 2019.  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 Neuropsychologist

## 2019-11-18 ENCOUNTER — Ambulatory Visit: Payer: Medicare Other | Admitting: Psychology

## 2020-01-11 ENCOUNTER — Ambulatory Visit: Payer: Medicare Other | Admitting: Psychology

## 2020-04-10 IMAGING — CT CT HEAD WITHOUT CONTRAST
3 series · 15 of 46 positions shown, 18 images · non-contrast
Comparison: None.

CLINICAL DATA: AMS, hallucinations, confusion

EXAM:
CT HEAD WITHOUT CONTRAST
TECHNIQUE: Contiguous axial images were obtained from the base of the skull
through the vertex without intravenous contrast.

[Series 2: head wo · axial · 0.47mm/px · z∈[-105,+15]mm · 9 of 29 slices shown, 12 images]
[im 3/29  brain]
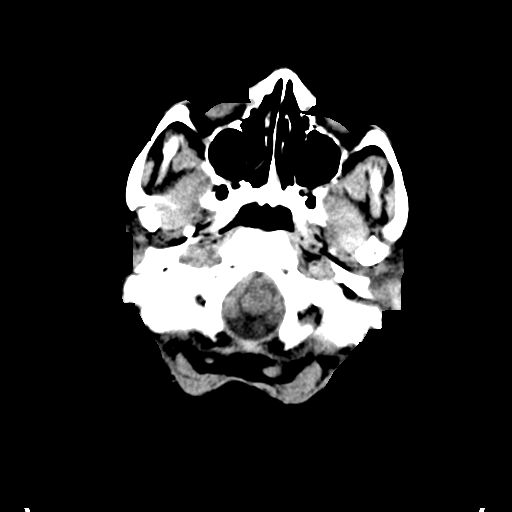
[im 3/29  bone]
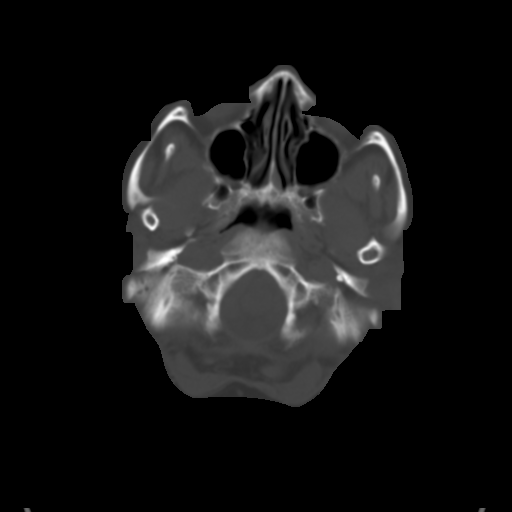
[im 6/29  brain]
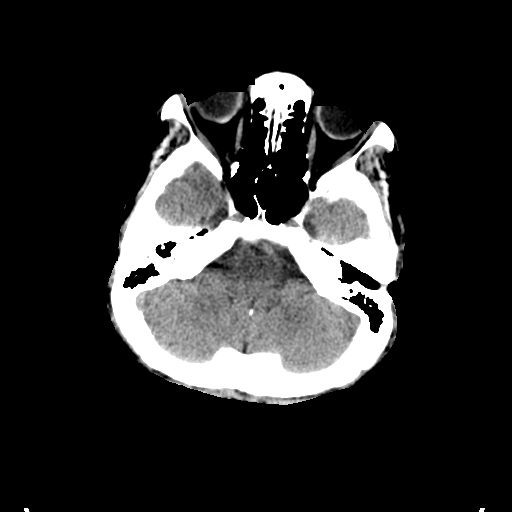
[im 9/29  brain]
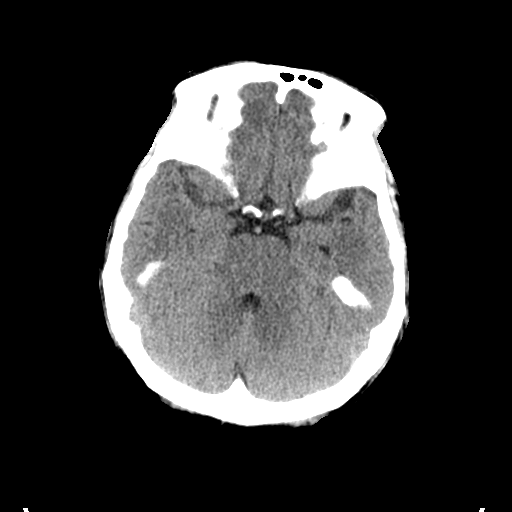
[im 12/29  brain]
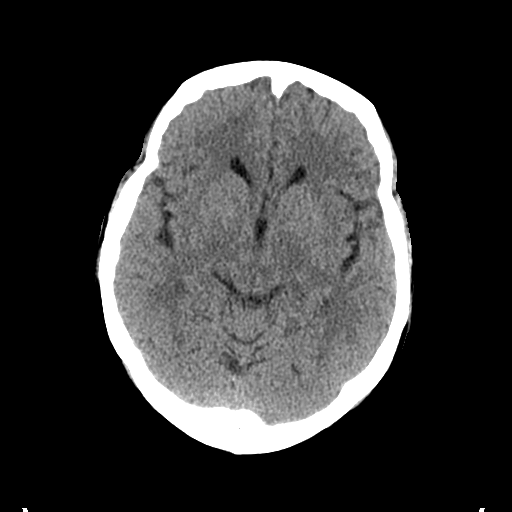
[im 15/29  brain]
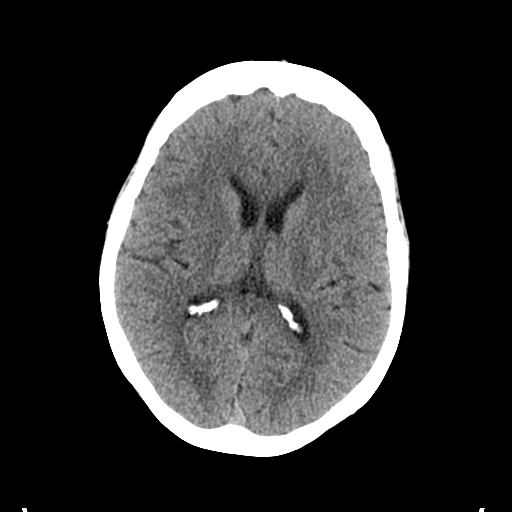
[im 15/29  bone]
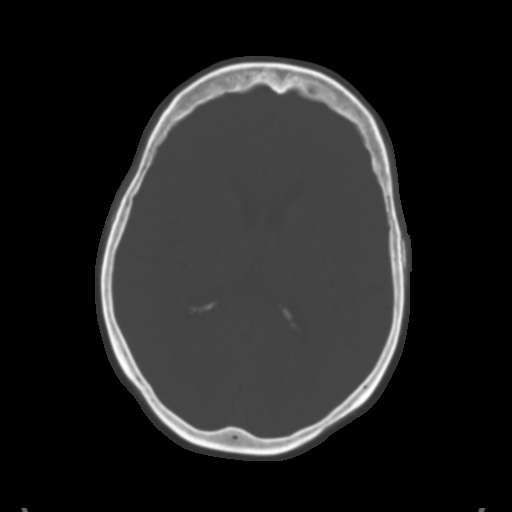
[im 18/29  brain]
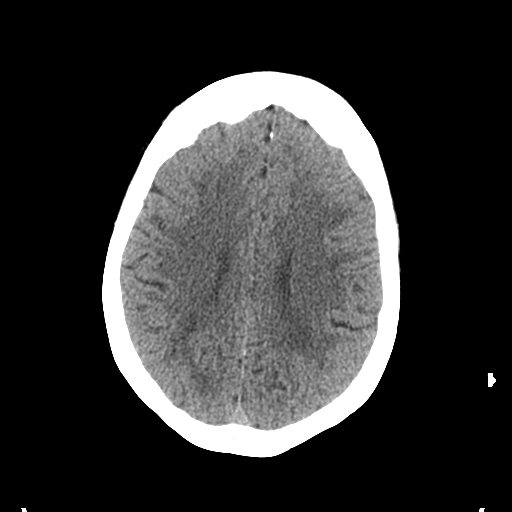
[im 21/29  brain]
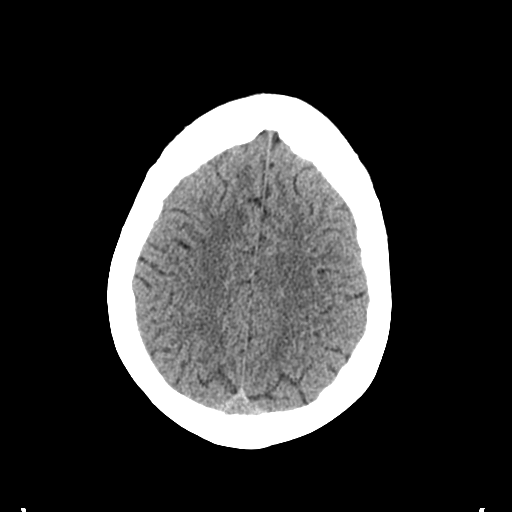
[im 24/29  brain]
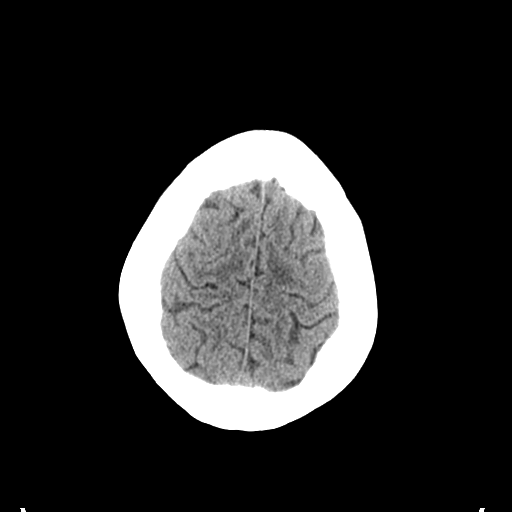
[im 27/29  brain]
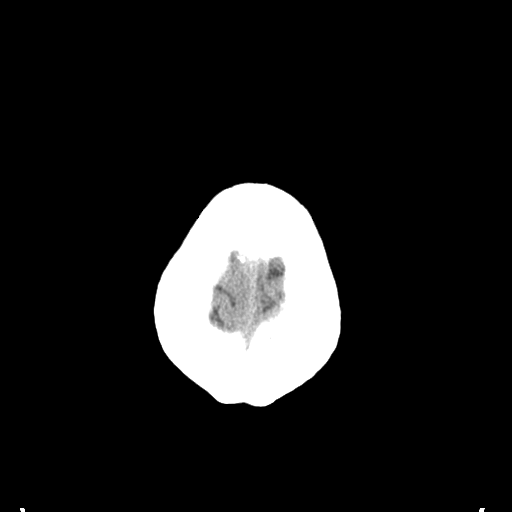
[im 27/29  bone]
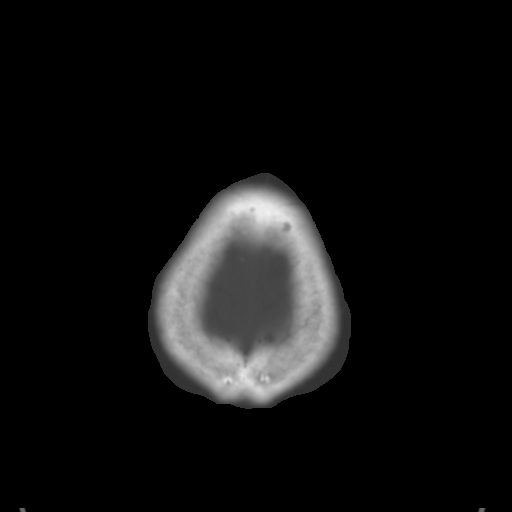

[Series 4: coronal soft tissue · coronal · 0.33mm/px · 3 of 63 slices shown]
[im 21/63  brain]
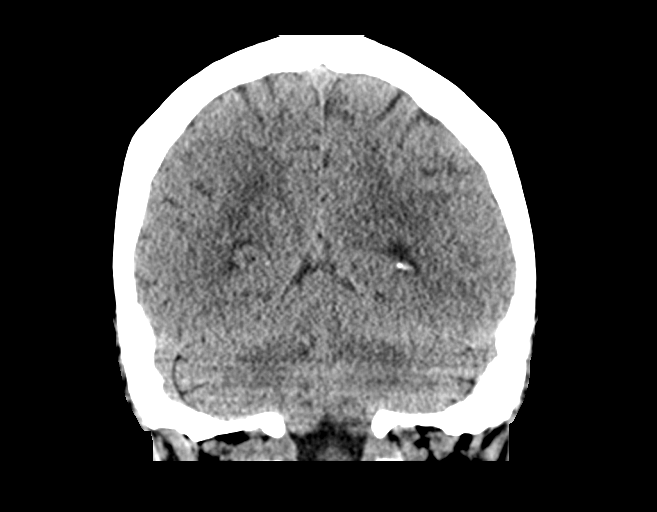
[im 28/63  brain]
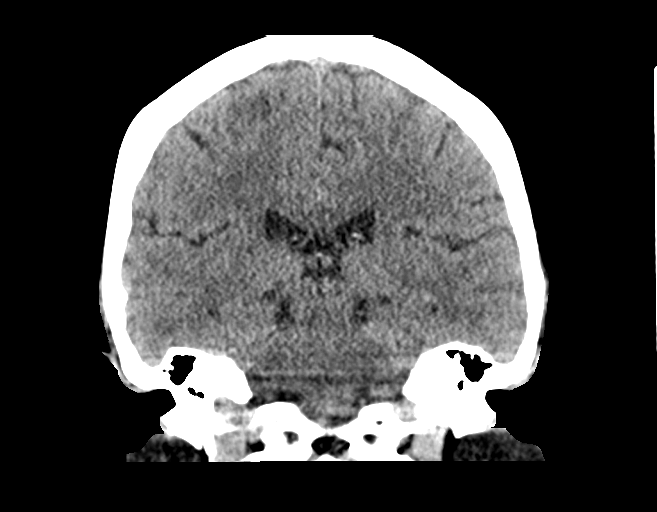
[im 35/63  brain]
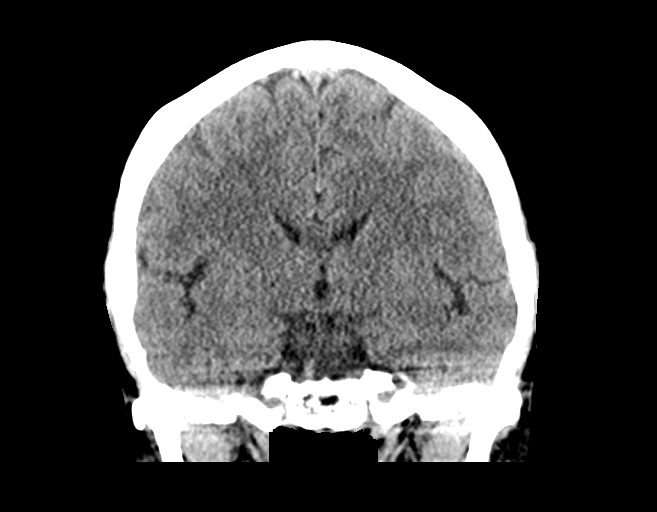

[Series 5: sagittal soft tissue · sagittal · 0.35mm/px · 3 of 50 slices shown]
[im 17/50  brain]
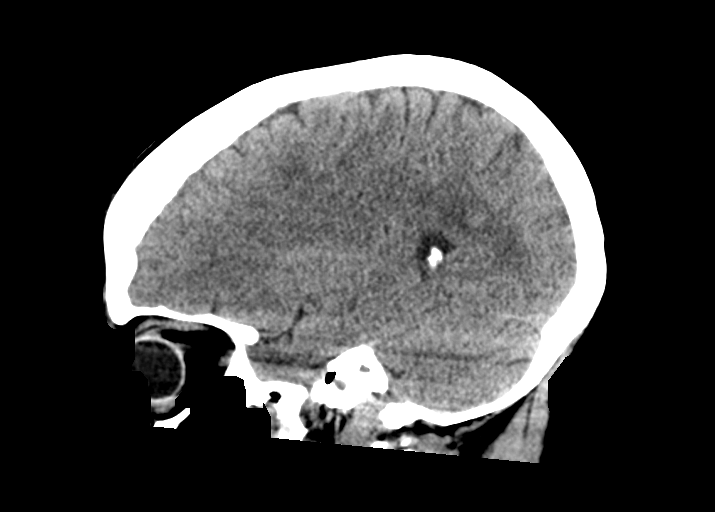
[im 25/50  brain]
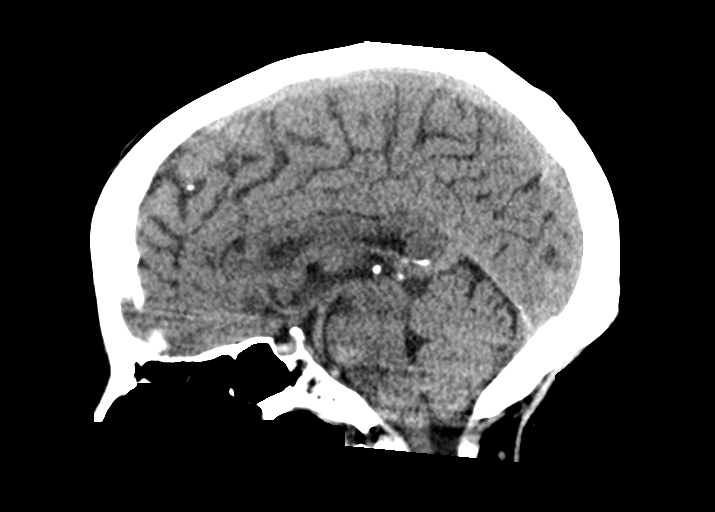
[im 33/50  brain]
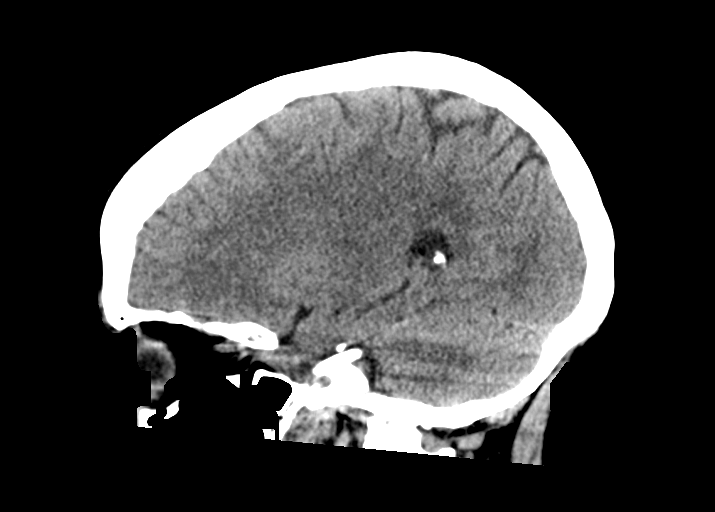

[15 of 46 positions shown; findings below may reference images not displayed]

FINDINGS: Brain: No evidence of acute infarction, hemorrhage, hydrocephalus,
extra-axial collection or mass lesion/mass effect. Periventricular
and deep white matter hypodensity.

Vascular: No hyperdense vessel or unexpected calcification.

Skull: Normal. Negative for fracture or focal lesion.

Sinuses/Orbits: No acute finding.

Other: None.
IMPRESSION: No acute intracranial pathology.  Small-vessel white matter disease.
